# Patient Record
Sex: Female | Born: 1970 | Race: Black or African American | Hispanic: No | Marital: Single
Health system: Midwestern US, Community
[De-identification: ages and names within clinical notes are randomized; demographics above are authoritative.]

## PROBLEM LIST (undated history)

## (undated) DIAGNOSIS — I10 Essential (primary) hypertension: Secondary | ICD-10-CM

## (undated) DIAGNOSIS — M5441 Lumbago with sciatica, right side: Secondary | ICD-10-CM

## (undated) DIAGNOSIS — E041 Nontoxic single thyroid nodule: Principal | ICD-10-CM

## (undated) DIAGNOSIS — M4802 Spinal stenosis, cervical region: Principal | ICD-10-CM

## (undated) DIAGNOSIS — M545 Low back pain, unspecified: Principal | ICD-10-CM

---

## 2008-10-11 NOTE — Unmapped (Signed)
Signed by Non-EMR  Physician on 10/11/2008 at 00:00:00    Lab Report      Imported By: Pedro Earls 11/17/2008 15:04:35    _____________________________________________________________________    External Attachments:       1. Type: Image           Comment: External Document     2. Type: Image           Comment: External Document

## 2008-11-03 ENCOUNTER — Inpatient Hospital Stay

## 2008-11-03 LAB — INDIA INK: India Ink:: NORMAL
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
## 2009-01-06 ENCOUNTER — Inpatient Hospital Stay

## 2010-04-26 NOTE — Unmapped (Signed)
THE [facility]     PATIENT NAME:   Denise Robles, Denise Robles                MR #:  [id number]   DATE OF BIRTH:  [birth year]                        ACCOUNT #:  [id number]   ED PHYSICIAN:   Clementeen Hoof. Lowella Petties, M.D.          ROOM #:   PRIMARY:        No Pcp No Pcp                     NURSING UNIT:  ED   REFERRING:      Selected Referral Pt              FC:  S   DICTATED BY:    Arva Chafe, P.A.              ADMIT DATE:  04/26/2010   VISIT DATE:     04/26/2010                        DISCHARGE DATE:                               EMERGENCY DEPARTMENT NOTE     *-*-*     ***ADDENDUM - 04/26/2010 - AMW***     At this time, we are pending CT of the abdomen and pelvis with IV and oral   contrast to evaluate for her right lower quadrant pain.    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  At this time, she has been tolerating orals.  She   has had improvement in her pain.  She has had fluids and Dilaudid.  At this   time, she will be discharged home.  I did call gynecology, given the   patient's continued vaginal bleeding for the last month.  Her hemoglobin is   stable.  She has no evidence of fibroids on her ultrasound of the pelvis;   however, the patient does have a significant history of endometriosis,   extending up into the umbilicus, status post repair in 2003.  I do suspect   that her pain is likely related to maybe a recurrence of her endometriosis.   The patient also needs to be evaluated for her dysfunctional uterine   bleeding.  She is hemodynamically stable.  She will follow up on 10/25 in the   gynecology clinic at 8:00 a.m.  She will be sent home with pain medication   and nonsteroidal antiinflammatory drugs, abdominal pain discharge   instructions.  She is to return for any worsening pain, fevers, swelling,   nausea, vomiting or any worsening bleeding.     She agreed with the treatment and plan.  Dr.  Lowella Petties agreed with the   treatment and plan.     IMPRESSION:     1.  Abdominal pain.   2.  Dysfunctional vaginal uterine bleeding.     DISPOSITION:  Home in good condition.       *-*-*  _______________________________________   KP/ad                                  _____   D:  04/26/2010 15:07                  Arva Chafe, P.A.   T:  04/26/2010 16:28   Job #:  [id number]                                         _______________________________________                                          _____                                         Clementeen Hoof. Lowella Petties, M.D.                                  EMERGENCY DEPARTMENT NOTE                                                                PAGE    1 of   1                                  EMERGENCY DEPARTMENT NOTE                                                                PAGE    1 of   1

## 2010-04-26 NOTE — Unmapped (Signed)
THE [facility]     PATIENT NAME:   Denise Robles, Denise Robles                MR #:  [id number]   DATE OF BIRTH:  04/23/1971                        ACCOUNT #:  [id number]   ED PHYSICIAN:   Clementeen Hoof. Lowella Petties, M.D.          ROOM #:   PRIMARY:        No Pcp No Pcp                     NURSING UNIT:  ED   REFERRING:      Selected Referral Pt              FC:  S   DICTATED BY:    Arva Chafe, P.A.              ADMIT DATE:  04/26/2010   VISIT DATE:     04/26/2010                        DISCHARGE DATE:                               EMERGENCY DEPARTMENT NOTE     *-*-*     CHIEF COMPLAINT:  Right lower quadrant abdominal pain.     HISTORY OF PRESENT ILLNESS:  This is a 39 year old female who presents to the   emergency department with the above complaint.  She states that she has had   vaginal bleeding off and on for the last three weeks.  She states that it was   fairly mild to start and has become increasingly more heavy.  She states that   she has noticed some clots over the course of the last week.  She denies any   vaginal discharge.  She is sexually active with two people.  She states that   she has had a tubal ligation in the past.  She endorses pain which started   two days ago.  It was initially fairly mild in the right lower quadrant and   has progressed to becoming severe, colicky in nature, and radiating around to   her back.  She states that the pain comes, and she cannot do anything except   for go over into a fetal position.  She states that she has been eating and   drinking normally.  She denies any nausea or vomiting.  She states that she   has had decreased bowel movements secondary to slightly decreased oral intake   secondary to the pain.  She has taken two Tylenol which have not   significantly relieved the pain.  She is concerned for the bleeding.  She has   a history of endometriosis status post laparoscopy in 2007.  She is G3P3.   She states that she is currently not  pregnant.  She denies any pain in the   lower extremity.  She states that nothing has made her pain better   significantly. Nothing really makes it worse except for lying flat on her   back.  She states that she has not had any fevers that she knows of. She has   felt more chilled.  She has not been seen by  a primary care physician.     PAST MEDICAL HISTORY:     1.  History of endometriosis status post laparoscopy in 2007.     CURRENT MEDICATIONS:     1.  Tylenol as needed.     ALLERGIES:     1.  Penicillin which causes hives.     FAMILY HISTORY:  Noncontributory.     SOCIAL HISTORY:  The patient lives with her children.  She endorsed four   cigarettes daily, endorses alcohol socially.  Denies any drug use.     REVIEW OF SYSTEMS:  As above. Otherwise, negative per the patient.     PHYSICAL EXAMINATION:     VITAL SIGNS:  Blood pressure 176/114, pulse 92, respirations 22, temperature   98.1, sats 99% on room air.   GENERAL:  Well-developed, well-nourished 39 year old female in no acute   distress, but she appears uncomfortable.  She is alert. She is oriented x 4.   She ambulates in the emergency department.   HEENT:  Normocephalic without sign of trauma.  Pupils are round and reactive   to light and accommodation.  Extraocular movements intact.  Oropharynx clear   without lesions.  Mucous membranes moist.   NECK:  Supple without masses or lymphadenopathy.   HEART:  Regular rate and rhythm.  I do not appreciate a murmur.   LUNGS:  Clear to auscultation bilaterally. No wheezes, rhonchi or rales.   Equal breath sounds bilaterally.   ABDOMEN:  Soft.  The patient has tenderness focally in the right lower   quadrant extending now deeper into the pelvic region.  She has no true   guarding. The abdomen is soft.  She has no other tenderness to palpation to   the remainder of the abdomen.  Bowel sounds are present and active in all   four quadrants.  She reports mild suprapubic tenderness.  There is no   distention to the  abdomen.   BACK:  No midline tenderness.  There is no CVA tenderness.   EXTREMITIES:  The patient moves all extremities spontaneously.  Palpable   radial pulses 2+, palpable dorsalis pedis pulse 2+.   NEUROLOGIC:  Cranial nerves II through XII grossly intact.  The patient is   alert and oriented.  She ambulates in the emergency department.   GENITOURINARY:  The patient has normal external female anatomy.  There is   noted blood in the vaginal vault, mild to moderate amount.  The cervix was   visualized.  The os is closed.  There is no obvious purulent discharge noted.   There is no cervical motion tenderness.  There is tenderness to palpation   over the right adnexal area.  No masses palpated.  There is mild tenderness   noted to palpation to the left adnexal area.  Again, no masses palpated.     EMERGENCY DEPARTMENT COURSE:  The patient was seen and evaluated by myself,   as well as my attending.  She underwent history and physical.  She was given   1 mg of Dilaudid, a liter of fluids.  She was given a second mg of Dilaudid.   She underwent laboratory evaluation. She had a pelvic ultrasound done here in   main ultrasound, and a urinalysis was obtained.  At this time, she is pending   a CT of the abdomen and pelvis with IV and oral contrast for evaluation for   right lower quadrant pain with concern for possible appendicitis or other  intra-abdominal abnormality.  She is hemodynamically stable here.     Laboratory evaluation reveals a white count of 7.6, hemoglobin 11.3,   hematocrit 36.1, platelets 298.  There is no left shift.  Sodium 140,   potassium 4.1, chloride 109, CO2 27, BUN 6, creatinine 0.6.  AST 25, ALT 19,   bilirubin 0.3, protein 8, albumin 4, calcium 9.5, lipase 74, alkaline   phosphatase 78.  Coags are unremarkable.  Urinalysis reveals negative   protein, ketones, sugar, rare bacteria, trace leukocyte esterase, negative   nitrite, 1 white blood cell, 2 red cells, bilirubin is negative. Trichomonas    is negative.  DNAP is pending.     Please see addendum dictation for results of CT Scan.     Imaging of the pelvis via ultrasound reveals normal uterus in contour and   echogenicity. The endometrial stripe measures 6.3 mm in thickness, mildly   heterogenous likely due to ongoing menses, a trace amount of pelvic free   fluid.  Ovaries are normal in appearance bilaterally.  Impression: Normal   pelvic ultrasound.     Please see addendum dictation for ultimate disposition.       *-*-*                                             _______________________________________   KP/jli                                 _____   D:  04/26/2010 12:09                  Arva Chafe, P.A.   T:  04/26/2010 13:04   Job #:  [id number]                                         _______________________________________                                          _____                                         Clementeen Hoof. Lowella Petties, M.D.                                  EMERGENCY DEPARTMENT NOTE                                                                PAGE    1 of   1                                          _____  Clementeen Hoof. Lowella Petties, M.D.                                  EMERGENCY DEPARTMENT NOTE                                                                PAGE    1 of   1

## 2010-05-01 ENCOUNTER — Inpatient Hospital Stay

## 2010-05-01 LAB — POC URINALYSIS
Bilirubin Urine: NEGATIVE
Glucose, UA: NEGATIVE g/dL
Ketones, UA: NEGATIVE
Leukocytes, UA: NEGATIVE
Nitrite, UA: NEGATIVE
Protein, UA: NEGATIVE
Specific Gravity, UA: <=1.005 (ref 1.005–1.035)
Urobilinogen, UA: 0.2 E units/dL (ref 0.2–1.0)
pH, UA: 6 (ref 5.0–8.0)

## 2010-05-01 NOTE — Unmapped (Signed)
Signed by Berdie Ogren MD on 05/01/2010 at 10:46:31  Patient: Denise Robles  Note: All result statuses are Final unless otherwise noted.    Tests: (1) POC HCG QUALITATIVE, URINE (POCPREG)  ! hCG Qualitative           Negative                    Negative    Note: An exclamation mark (!) indicates a result that was not dispersed into   the flowsheet.  Document Creation Date: 05/01/2010 10:35 AM  _______________________________________________________________________    (1) Order result status: Final  Collection or observation date-time: 05/01/2010 10:23  Requested date-time: 05/01/2010 10:23  Receipt date-time: 05/01/2010 09:56  Reported date-time: 05/01/2010 10:34  Referring Physician: Cindi Carbon NONSTAFF  Ordering Physician: REFERRING(UOP) NONSTAFF [facility])  Specimen Source: U&URINE     UACUP&URINALYSIS CONTAINER  Source: Faith Rogue Order Number: [id number]  Lab site: [facility]      [address]      [place] [address]  [phone]

## 2010-05-01 NOTE — Unmapped (Signed)
Signed by Berdie Ogren MD on 05/01/2010 at 10:46:31  Patient: Denise Robles  Note: All result statuses are Final unless otherwise noted.    Tests: (1) POC URINALYSIS (POCUA)    Glucose, Urine            Negative mg/dL              Negative    Bilirubin, Urine          Negative                    Negative    Ketone                    Negative mg/dL              Negative    Specific Gravity          <=1.005                     1.005-1.035    Blood                [A]  Trace-intact                Negative    pH                        6.0                         5.0-8.0    Protein, urine            Negative mg/dL              Negative    Urobilinogen              0.2 mg/dL                   7.2-5.3    Nitrite                   Negative                    Negative    Leukocyte Esterase        Negative                    Negative    Note: An exclamation mark (!) indicates a result that was not dispersed into   the flowsheet.  Document Creation Date: 05/01/2010 9:42 AM  _______________________________________________________________________    (1) Order result status: Final  Collection or observation date-time: 05/01/2010 09:35  Requested date-time: 05/01/2010 09:35  Receipt date-time: 05/01/2010 09:03  Reported date-time: 05/01/2010 09:41  Referring Physician: Cindi Carbon NONSTAFF  Ordering Physician: REFERRING(UOP) NONSTAFF [facility])  Specimen Source: U&URINE     UACUP&URINALYSIS CONTAINER  Source: Faith Rogue Order Number: [id number]  Lab site: [facility]      [address]      [place] [address]  [phone]      -----------------    The following lab values were dispersed to the flowsheet  with no units conversion:      Urobilinogen, 0.2 MG/DL, (F)  expected units: E units/dL    -----------------    The following non-numeric lab results were dispersed to  the flowsheet even though numeric results were expected:      Glucose, Urine, Negative    Specific Gravity, <=1.005

## 2010-05-01 NOTE — Unmapped (Signed)
Signed by Lowella Petties MD on 05/01/2010 at 13:17:14        Reason for Visit   Chief Complaint: here for bleeding along wth severe pain  History from: patient    Allergies  ! PENICILLIN    Medications  FLEXERIL  TABS (CYCLOBENZAPRINE HCL TABS)   IBUPROFEN  CAPS (IBUPROFEN CAPS) prescription stregth as needed          Vital Signs  Height: 65 in.   Weight: 195.3 lbs.     BMI (in-lb): 32.62   BSA (m2): 1.96  Pulse rate: 100   Temperature: 98.2 degrees  F   Blood Pressure   BP #1: 160 / Hg  Cuff Size: Std     Pain:     Have you had pain other than everyday aches and pains (e.g., mild headache, back ache, strains) in the past week?   Yes  Pain Scale: 10 out of 10    Intake recorded by: Collier Salina MA on May 01, 2010 9:35 AM               History of Present Illness       39 yo G3P3 with a history fo endometriosis here for follow up from ED 04/26/10 for pelvic/abdominal pain and abnormal bleeding.  Pt has had VB for approximately 3 weeks, which stopped two days ago.  Pt states that her periods have been regular until the past few weeks.   Pain sharp, started suprapubically and now is in the RLQ/lower back region.  Sharp, aching, not taking vicodin because it makes her feel sick.  Pain better with standing, worse with sitting.  Has gotten worse since seen in the ED last week.  Starting to have nausea but no vomiting. Last BM yesterday.    TVUS showed 9.2x4.3x6.7cm uterus with endometrial stripe 6.73mm, normal ovaries.  CT scan negative. GC/CT negative, Wet prep negative 10/20.      PAST HISTORY  Past Medical History:  No significant past medical history.  Surgical History:  Cesarean Section: *, Laparoscopy: for endometriosis 2007, endometerosis removal, No problems with anesthesia, No problems with healing, No problems with blood clots after surgery    Family History: no breast/ovarian/colon cancer    Mother - stroke, HTN  Father - DM  Uncle - CA  Social History: Alcohol Use: occasionally  Beedies (indonesian  cigarette, not tobacco based)  Drug Use: none  Tobacco Usage:smoker  Cigarettes-Packs per Day- 0.2,           Physical Examination  Constitutional: Crying..    Abdomen: nondistended, no masses, no organomegaly, no abdominal hernias, no suprapubic tenderness;  Tender to palpation RLQ, ? rebound, no guarding.  No back tenderness..    External Genitalia:  no lesions, normal estrogen effect, normal hair pattern, no clitorimegaly, no erythema, normal perineal body;    Urethra:  normal appearance, no erythema, no tenderness, normal urethral meatus;    Vagina:  no discharge, no lesions, no masses, no cystocele, no rectocele, normal support, normal rugations;    Cervix:  present, no lesions, no discharge, no cervical motion tenderness;    Uterus:  uterus midline, normal size, non-tender, regular contour, mobile;    Adnexa:  no masses;  mildly tender to palpation in right adnexa, no masses or fullness.  Pubic symphysis tender to palpation. Tight muscular band felt at 10:00..    Back: no deformities, normal spine, no cva tenderness;  right SI joint pain.Marland Kitchen    Extremities: no edema,  no lesions;                      Procedure Performed: Trigger Point Injections     Procedure in Detail:   The patient's chart was reviewed.  The area was prepped using antiseptic solution.      The following trigger points were identified:      Lumbar/Sacral:   Right trigger point @ 10:00 injected in vagina.    Then, a 18 gauge  needle was inserted into each point.  A total mixture of 20 ml of 0.5% Marcaine 9cc with 1cc kenalog 4mg /ml., 5 ml solution was injected into each point.      The patient tolerated the procedure well.      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  1.  Trigger point injection given - pt feeling much better afterwards.  2.  Amitriptylene 25mg  at bedtime and ibuprofen 800mg  three times a  day.  3.  Pt instructed to have modified bedrest, take hot baths, and to massage area.  4.  RTC 1 week for follow up. - Berdie Ogren MD - Signed    Medications   New Prescriptions/Refills:  AMITRIPTYLINE HCL 25 MG  TABS (AMITRIPTYLINE HCL) one tablet by mouth at bedtime.  #30 x 1, 05/01/2010, Sz Fredrich Romans MD  IBUPROFEN 800 MG TABS (IBUPROFEN) one tablet by mouth every 8 hours hours.  #30 x 1, 05/01/2010, Berdie Ogren MD      Disposition:     *Patient Care Summary printed and given to patient.    Primary OB/GYN Provider: Imogene Burn  Return to clinic for Provider Visit in 1 week(s)   Appointment Reason: okay to book at 7:45am.      Preceptor Acknowledgements    I saw and examined the patient.  I discussed with the resident or fellow and agree with resident's/fellow's findings and plan as documented in the note.  A&P exam consistent with levator ani muscle spasm and SI joint pain as well as suprapubic symphysis pain.  trigger point injection today helped resolve pain.  follow-up one week for repeat trigger point injection.    Approximately 80 minutes was spent with this patient, over 45 minutes in face-to-face consultation.     Preceptor Acknowledgements Procedures:     I was present for the:   entire procedure    Preceptor: Lowella Petties MD on May 01, 2010 1:10 PM    Prescriptions:  AMITRIPTYLINE HCL 25 MG  TABS (AMITRIPTYLINE HCL) one tablet by mouth at bedtime.  #30 x 1   Entered and Authorized by: Berdie Ogren MD   Signed by: Berdie Ogren MD on 05/01/2010   Method used: Handwritten   RxID: [id number]  IBUPROFEN 800 MG TABS (IBUPROFEN) one tablet by mouth every 8 hours hours.  #30 x 1   Entered and Authorized by: Berdie Ogren MD   Signed by: Berdie Ogren MD on 05/01/2010   Method used: Handwritten   RxID: [id number]

## 2010-05-15 NOTE — Unmapped (Signed)
THE [facility]     PATIENT NAME:   Denise Robles, Denise Robles                MR #:  [id number]   DATE OF BIRTH:  1971/01/27                        ACCOUNT #:  [id number]   ED PHYSICIAN:   Earlean Shawl. Mora Bellman, M.D.           ROOM #:   PRIMARY:        No Pcp No Pcp                     NURSING UNIT:  ED   REFERRING:      Selected Referral Pt              FC:  S   DICTATED BY:    Arthuro Canelo L. Urbano Heir, M.D.            ADMIT DATE:  05/14/2010   VISIT DATE:     05/14/2010                        DISCHARGE DATE:                               EMERGENCY DEPARTMENT NOTE     *-*-*     CHIEF COMPLAINT:  Chest pain.     HISTORY OF PRESENT ILLNESS:  This is a 39 year old female with no significant   past medical history who comes in with the chief complaint of chest pain.   The patient reports that this pain started last night in her chest and she   says that it hurts when she swallows.  She reports that she feels like she is   having difficulty swallowing because of this, but she has been able to get   liquids and solids down.  Despite this pain, the patient is not having any   regurgitation of food.  The patient also reports that she has felt slightly   short of breath when she lays down but when she sits back up, she feels   normal.  She says that the chest pain gets slightly worse with deep breath.   Overall, she says that the pain is immediately within the center of her chest   and is a hot, tight sensation and it nothing makes it better or worse.  The   patient says that she has been taking a lot of ibuprofen because of this, but   she denies any fever, positional quality to the pain, arm swelling, previous   symptoms like this, palpitations, abdominal pain, nausea, vomiting, diarrhea   or fever.     REVIEW OF SYSTEMS:  Please see HPI.  All other review of systems reviewed   with the patient and are negative.     PAST MEDICAL HISTORY:     1.  Endometriosis.     MEDICATIONS:     1.  Ibuprofen.   2.   Elavil.     ALLERGIES:     1.  Penicillin.     FAMILY HISTORY:  The patient is not aware of any diseases that run in her   family.     SOCIAL HISTORY:  This patient smokes one pack per day, occasional alcohol,   occasional marijuana.  PHYSICAL EXAMINATION:   VITAL SIGNS:  Blood pressure 185/107, pulse 99, respiratory rate 20,   temperature 100.1, saturations 100% on room air.   GENERAL:  The patient is in mild acute distress, appears stated age, good   hygiene, normal development.   HEENT:  Normocephalic, atraumatic.  Pupils equally round and reactive to   light.  Extraocular movements intact.  No conjunctival injection, icterus or   discharge.  Oropharynx is clear without lesions, exudate or erythema.  Mucous   membranes are moist.   NECK:  No lymphadenopathy, no thyromegaly.  Trachea midline.  No JVD, no   nuchal rigidity.   CARDIOVASCULAR:  Regular rate and rhythm.  Clear S1, S2.  No murmurs.   LUNGS:  Clear to auscultation bilaterally.  Good breath sounds.  No rales,   rhonchi, wheezing, or stridor.   CHEST:  No pain on palpation of her chest.   ABDOMEN:  Soft, nontender, nondistended, no organomegaly.  She has good bowel   sounds.   GENITOURINARY:  No suprapubic pain.  No CVA tenderness.   MUSCULOSKELETAL:  Normal muscle bulk, full range of motion.  No joint   swelling, erythema or pain.   NEUROLOGIC:  The patient is alert and oriented x3.  No gross neurological   defects.   SKIN:  No rash, dry.   EXTREMITIES:  No cyanosis, good capillary refill, good pulses, no edema.   PSYCHIATRIC:  Pleasant, conversant and answers appropriately.     LABORATORY DATA AND IMAGING:  CBC significant only for a white blood cell   count of 15.1, otherwise within normal limits.  LFTs and lipase and   electrolyte panel within normal limits.     Chest x-ray showed no acute cardiopulmonary process.     TSH and T4 were within normal limits.  Aspirin level was within normal   limits.  Troponin sent via nursing protocol was within  normal limits.     EKG done via nursing protocol.  Normal sinus rhythm, normal axis, normal PR,   QRS and QT intervals, no ST or T-wave changes.  Left ventricular hypertrophy   present.  Impression:  Normal sinus rhythm with left ventricular hypertrophy.     EMERGENCY DEPARTMENT COURSE:  The patient was admitted to the emergency   department and evaluated by myself and the attending physician.  The   assessment and plan was discussed and agreed upon.  Diagnostic tests were   obtained as discussed above.  Nursing notes and old charts were reviewed.   The patient was given a GI cocktail to drink here in the emergency department   and this significantly helped her symptoms.     MEDICAL DECISION MAKING:  At this time, I think likely her symptoms of chest   pain are due to some form of esophagitis.  It is not clear to me exactly what   is the cause of her esophagitis as it could be due to reflux, it could be due   to a Mallory-Weiss tear, it could be due to an infection such as candidiasis   or CMV, but there are no obvious signs of any of these on my exam here today.   So, ultimately, I think the patient will need an upper endoscopy to help   determine what the cause of her symptoms are.  Given the fact that she is   having difficulty swallowing and she has pain in her chest with swallowing, I   think this likely points to  the esophagus, especially with her good response   to a GI cocktail.  I think this also indicates likely that this is due to her   esophagus.  The patient has had a stress test back in March of 2010, which   was normal, but given how atypical this is I do not believe that she needs a   further workup for ACS nor does she need a further workup for pulmonary   embolism since she is otherwise saturating normally and she is not tachypneic   and her shortness of breath was only when she laid down flat.  At this point,   I will be starting her on omeprazole in case she is having gastritis which is   causing  reflux causing esophagitis and I will help get her follow-up to get   an upper endoscopy as she does not currently have a doctor.     IMPRESSION:     1.  Esophagitis.     DISPOSITION:  Home in good condition.     PLAN:     1.  The patient instructed to take Tylenol as needed for pain.   2.  Omeprazole 20 mg by mouth every day.   3.  Crissie Figures was contacted to help get the patient a semi urgent   followup with a PCP.   4.  The patient was told her to take Pepto-Bismol to help with esophageal   pain.   5.  The patient was told to return to the emergency department for any   worsening of her symptoms or for any new symptoms that concern her.     This entire plan was discussed with the patient and she expressed   understanding and was in agreement.       *-*-*                                             _______________________________________   JLB/md                                 _____   D:  05/15/2010 00:16                  Torryn Hudspeth L. Urbano Heir, M.D.   T:  05/15/2010 08:37   Job #:  [id number]                                         _______________________________________                                          _____                                         Earlean Shawl. Mora Bellman, M.D.                                  EMERGENCY DEPARTMENT NOTE  PAGE    1 of   1                                         Donald A. Mora Bellman, M.D.                                  EMERGENCY DEPARTMENT NOTE                                                                PAGE    1 of   1

## 2013-04-27 MED ORDER — AMLODIPINE BESYLATE 5 MG PO TABS
5 MG | ORAL_TABLET | Freq: Every day | ORAL | Status: DC
Start: 2013-04-27 — End: 2013-05-11

## 2013-04-27 NOTE — Progress Notes (Signed)
Subjective:      Patient ID: Betty Cox is a 42 y.o. female.    HPI  Here to become established  She had been to [facility] ER after a fall   There her blood pressure was elevated and was tod to follow up with PCP   She has had some previous elevations of her blood pressure but has not been on any medications  Sometimes they will run 156-165/90-100  She denies any headache, chest pain or shortness of breath with this  She is attempting to quit smoking and has been enrolled in [employer]'s smoking cessation  She wants to start working out but needed medical clearance     Review of Systems   Eyes: Negative for visual disturbance.   Respiratory: Negative for cough, chest tightness, shortness of breath and wheezing.    Cardiovascular: Negative for chest pain, palpitations and leg swelling.   Neurological: Negative for headaches.       Objective:   Physical Exam   Constitutional: She is oriented to person, place, and time. She appears well-developed and well-nourished.   HENT:   Right Ear: External ear normal.   Left Ear: External ear normal.   Eyes: Conjunctivae and EOM are normal. Pupils are equal, round, and reactive to light.   Neck: No thyromegaly present.   Cardiovascular: Normal rate, regular rhythm, normal heart sounds and intact distal pulses.  Exam reveals no gallop and no friction rub.    No murmur heard.  Pulmonary/Chest: Effort normal and breath sounds normal. No respiratory distress. She has no wheezes. She has no rales.   Abdominal: Soft. Bowel sounds are normal. She exhibits no distension. There is no tenderness. There is no rebound.   No abdominal bruit     Musculoskeletal: She exhibits no edema.   Lymphadenopathy:     She has no cervical adenopathy.   Neurological: She is alert and oriented to person, place, and time.   Skin: Skin is warm and dry.   Psychiatric: She has a normal mood and affect.       Assessment:         1. HTN (hypertension)  Lipid Panel    Comprehensive Metabolic Panel    CBC Auto  Differential    TSH without Reflex   2. Screening for malignant neoplasm of breast  MAM Digital Screen Bilateral            Plan:       start norvasc 5 mg and follow up 2 weeks  Plan to titrate to 10 mg and if not controlled by this add on lotensin ( lotrel)  She does not want flu shot  Mammogram scheduled

## 2013-04-28 LAB — LIPID PANEL
Cholesterol, Total: 140 mg/dL (ref 0–199)
HDL: 64 mg/dL — ABNORMAL HIGH (ref 40–60)
LDL Calculated: 63 mg/dL (ref <100)
Triglycerides: 64 mg/dL (ref 0–150)
VLDL Cholesterol Calculated: 13 mg/dL

## 2013-04-28 LAB — COMPREHENSIVE METABOLIC PANEL
ALT: 12 U/L (ref 10–40)
AST: 15 U/L (ref 15–37)
Albumin/Globulin Ratio: 1.3 (ref 1.1–2.2)
Albumin: 4.1 g/dL (ref 3.4–5.0)
Alkaline Phosphatase: 79 U/L (ref 40–129)
BUN: 6 mg/dL — ABNORMAL LOW (ref 7–20)
CO2: 22 mmol/L (ref 21–32)
Calcium: 9 mg/dL (ref 8.3–10.6)
Chloride: 103 mmol/L (ref 99–110)
Creatinine: 0.6 mg/dL (ref 0.6–1.1)
GFR African American: >60 (ref >60)
GFR Non-African American: >60 (ref >60)
Globulin: 3.2 g/dL
Glucose: 85 mg/dL (ref 70–99)
Potassium: 4.1 mmol/L (ref 3.5–5.1)
Sodium: 141 mmol/L (ref 136–145)
Total Bilirubin: <0.2 mg/dL (ref 0.0–1.0)
Total Protein: 7.3 g/dL (ref 6.4–8.2)

## 2013-04-28 LAB — CBC WITH AUTO DIFFERENTIAL
Basophils %: 0.4 %
Basophils Absolute: 0 10*3/uL (ref 0.0–0.2)
Eosinophils %: 0.9 %
Eosinophils Absolute: 0.1 10*3/uL (ref 0.0–0.6)
Hematocrit: 42.3 % (ref 36.0–48.0)
Hemoglobin: 13.3 g/dL (ref 12.0–16.0)
Lymphocytes %: 27.2 %
Lymphocytes Absolute: 2.3 10*3/uL (ref 1.0–5.1)
MCH: 27.1 pg (ref 26.0–34.0)
MCHC: 31.4 g/dL (ref 31.0–36.0)
MCV: 86.6 fL (ref 80.0–100.0)
MPV: 9.3 fL (ref 5.0–10.5)
Monocytes %: 7.7 %
Monocytes Absolute: 0.6 10*3/uL (ref 0.0–1.3)
Neutrophils %: 63.8 %
Neutrophils Absolute: 5.4 10*3/uL (ref 1.7–7.7)
Platelets: 289 10*3/uL (ref 135–450)
RBC: 4.89 M/uL (ref 4.00–5.20)
RDW: 15.6 % — ABNORMAL HIGH (ref 12.4–15.4)
WBC: 8.5 10*3/uL (ref 4.0–11.0)

## 2013-04-28 LAB — TSH: TSH: 0.85 u[IU]/mL (ref 0.27–4.20)

## 2013-05-11 MED ORDER — AMLODIPINE BESYLATE 10 MG PO TABS
10 MG | ORAL_TABLET | Freq: Every day | ORAL | Status: DC
Start: 2013-05-11 — End: 2013-09-21

## 2013-05-11 NOTE — Progress Notes (Signed)
Subjective:      Patient ID: Betty Cox is a 42 y.o. female.    HPI  Here for blood pressure follow up  She has been taking the norvasc 5 mg a day   She is tolerating this well   She states that her wrist cuff monitor at home has been giving her readings of 140-150/ 90's at home   She has been having some dizziness     Review of Systems   Respiratory: Negative for cough, shortness of breath and wheezing.    Cardiovascular: Negative for chest pain, palpitations and leg swelling.   Neurological: Positive for dizziness.       Objective:   Physical Exam   Constitutional: She is oriented to person, place, and time.   Cardiovascular: Normal rate, regular rhythm, normal heart sounds and intact distal pulses.  Exam reveals no gallop and no friction rub.    No murmur heard.  Pulmonary/Chest: Effort normal and breath sounds normal. No respiratory distress. She has no wheezes. She has no rales.   Musculoskeletal: She exhibits no edema.   Neurological: She is alert and oriented to person, place, and time.   Skin: Skin is warm and dry.   Psychiatric: She has a normal mood and affect.       Assessment:      1. HTN (hypertension)             Plan:       increase the Norvasc to 10 mg a day  Good rx coupon and website given  She will bring her monitor next visit  I believe there could be an element of "white coat" syndrome but we need to prove that with normal or good pressures at home ( with a monitor that is reliable) and elevated readings here   She will return in 2-3 weeks for recheck and monitor check

## 2013-05-11 NOTE — Patient Instructions (Signed)
Bring blood pressure monitor next visit so we can check  Try the web site good rx

## 2013-06-14 MED ORDER — HYDROCHLOROTHIAZIDE 12.5 MG PO CAPS
12.5 MG | ORAL_CAPSULE | Freq: Every day | ORAL | Status: DC
Start: 2013-06-14 — End: 2013-07-29

## 2013-06-14 NOTE — Progress Notes (Signed)
Subjective:      Patient ID: Betty Cox is a 42 y.o. female.    HPI  Here for follow up  She is feeling better with her blood pressure  She is not having any side effects from the medication    Review of Systems   Respiratory: Negative for cough, chest tightness and wheezing.    Cardiovascular: Negative for chest pain, palpitations and leg swelling.       Objective:   Physical Exam   Constitutional: She is oriented to person, place, and time.   Cardiovascular: Normal rate, regular rhythm, normal heart sounds and intact distal pulses.  Exam reveals no gallop and no friction rub.    No murmur heard.  Pulmonary/Chest: Effort normal and breath sounds normal. No respiratory distress. She has no wheezes. She has no rales.   Musculoskeletal: She exhibits no edema.   Neurological: She is alert and oriented to person, place, and time.   Skin: Skin is warm and dry.   Psychiatric: She has a normal mood and affect.       Assessment:        HTN       Plan:       my recheck is improved  I would add a small dose of microzide   Recheck in 6 weeks  She has a home monitor  She will recheck it at home and bring at next visit

## 2013-07-29 MED ORDER — HYDROCHLOROTHIAZIDE 25 MG PO TABS
25 MG | ORAL_TABLET | Freq: Every day | ORAL | Status: DC
Start: 2013-07-29 — End: 2014-06-07

## 2013-07-29 NOTE — Progress Notes (Signed)
Subjective:      Patient ID: Betty Cox is a 43 y.o. female.    HPI  Blood pressures running lower at home 130-140 and in the 80's diastolic  Battery is low and is not been checking it at home recently     Review of Systems   Respiratory: Negative for cough, chest tightness and wheezing.    Cardiovascular: Negative for chest pain, palpitations and leg swelling.       Objective:   Physical Exam   Constitutional: She is oriented to person, place, and time.   Cardiovascular: Normal rate, regular rhythm, normal heart sounds and intact distal pulses.  Exam reveals no gallop and no friction rub.    No murmur heard.  Pulmonary/Chest: Effort normal and breath sounds normal. No respiratory distress. She has no wheezes. She has no rales.   Musculoskeletal: She exhibits no edema.   Neurological: She is alert and oriented to person, place, and time.   Skin: Skin is warm and dry.   Psychiatric: She has a normal mood and affect.       Assessment:         1. HTN (hypertension)             Plan:       continue norvasc  I increased the HCTZ to 25 mg  Follow up 3 months annual  Ok to go to gym

## 2013-09-21 MED ORDER — AMLODIPINE BESYLATE 10 MG PO TABS
10 MG | ORAL_TABLET | Freq: Every day | ORAL | Status: DC
Start: 2013-09-21 — End: 2014-01-25

## 2013-09-21 NOTE — Telephone Encounter (Signed)
PT NEEDS A REFILL OF HER NORVASC * LAST OV ON 1.22.15. PT IS OUT OF HER MEDS.   PHARM# [phone]

## 2013-10-18 MED ORDER — HYDROCHLOROTHIAZIDE 12.5 MG PO CAPS
12.5 MG | ORAL_CAPSULE | ORAL | Status: DC
Start: 2013-10-18 — End: 2014-01-24

## 2014-01-04 NOTE — Progress Notes (Signed)
MAM Digital Screen Bilateral (Order [id number]) order not completed and expired

## 2014-01-24 MED ORDER — HYDROCHLOROTHIAZIDE 12.5 MG PO CAPS
12.5 MG | ORAL_CAPSULE | ORAL | Status: DC
Start: 2014-01-24 — End: 2014-01-25

## 2014-01-24 NOTE — Telephone Encounter (Signed)
PT CALLING FOR REFILLS ON HCTZ 12.5MG  AND AMLODIPINE 10MG  TO BE CALLED IN TO [facility] [phone]

## 2014-01-24 NOTE — Telephone Encounter (Signed)
Last ov 07/29/13 last refill 10/18/13

## 2014-01-25 MED ORDER — AMLODIPINE BESYLATE 10 MG PO TABS
10 MG | ORAL_TABLET | Freq: Every day | ORAL | Status: DC
Start: 2014-01-25 — End: 2014-06-06

## 2014-01-25 MED ORDER — HYDROCHLOROTHIAZIDE 12.5 MG PO CAPS
12.5 MG | ORAL_CAPSULE | ORAL | Status: DC
Start: 2014-01-25 — End: 2014-08-15

## 2014-06-06 MED ORDER — AMLODIPINE BESYLATE 10 MG PO TABS
10 MG | ORAL_TABLET | Freq: Every day | ORAL | Status: DC
Start: 2014-06-06 — End: 2014-07-15

## 2014-06-06 NOTE — Telephone Encounter (Signed)
LAST SEEN       NEXT APPOINTMENT       LAST FILL  1.22.15  None   Amlodipine- 7.21.15

## 2014-06-06 NOTE — Telephone Encounter (Signed)
PT CALLING FOR REFILL ON AMLODIPINE AND HCTZ TO BE CALLED IN TO [facility] [phone]

## 2014-06-07 MED ORDER — HYDROCHLOROTHIAZIDE 25 MG PO TABS
25 MG | ORAL_TABLET | Freq: Every day | ORAL | Status: DC
Start: 2014-06-07 — End: 2015-09-05

## 2014-06-08 NOTE — Telephone Encounter (Signed)
Pt calling in stating that she went to pick up script for hydrochlorothiazide (MICROZIDE) 12.5 MG capsule pt says that she normally takes this script and dose but somehow the script that was filled for the 25 mg dose patient says that she would like to stay on the 12.5 mg tab until speaking with Dr Doristine CounterGiulitto about the change. Pt says that she did not pick up the 25 mg script she left it at the pharmacy. [facility] [address] - [place], [place] - [address] - P [phone] F [phone]

## 2014-06-08 NOTE — Telephone Encounter (Signed)
Please Advise,  Thank you

## 2014-06-08 NOTE — Telephone Encounter (Signed)
Looking at the last office note with Dr. Doristine CounterGiulitto, it states she was increasing the HCTZ to 25 mg. This was from 07/29/2013. Somehow there were still refills of 12.5 being called in. She probably should also be making an appt to come in to be [facility], as her last office visit was almost 1 year ago on  07/29/2013

## 2014-06-10 NOTE — Telephone Encounter (Signed)
noted 

## 2014-07-15 MED ORDER — AMLODIPINE BESYLATE 10 MG PO TABS
10 MG | ORAL_TABLET | ORAL | Status: DC
Start: 2014-07-15 — End: 2014-08-15

## 2014-07-15 NOTE — Telephone Encounter (Signed)
PT CALLING TO LET DR KNOW SHE WILL NOT BE ABLE TO COME IN FOR APPT UNTIL FEB.   SHE HAS MADE APPT FOR 08-23-14.   SHE SAYS SHE IS NOT ALLOWED A DAY OFF IN JAN BECAUSE IT IS THEIR BUSIEST MONTH.   SHE APPRECIATES THE REFILL ON AMLODIPINE BUT WANTS DR TO KNOW SHE MAY NEED ANOTHER REFILL ON IT AND HCTZ UNTIL SHE IS IN FOR HER APPT    FYI

## 2014-07-15 NOTE — Telephone Encounter (Signed)
1 refill  Patient needs to schedule appt

## 2014-07-15 NOTE — Telephone Encounter (Signed)
LAST SEEN       NEXT APPOINTMENT       LAST FILL    1.22.15  None    11.30.15

## 2014-07-18 NOTE — Telephone Encounter (Signed)
That is fine

## 2014-08-15 MED ORDER — AMLODIPINE BESYLATE 10 MG PO TABS
10 MG | ORAL_TABLET | ORAL | Status: DC
Start: 2014-08-15 — End: 2014-08-23

## 2014-08-15 MED ORDER — HYDROCHLOROTHIAZIDE 12.5 MG PO CAPS
12.5 MG | ORAL_CAPSULE | ORAL | Status: DC
Start: 2014-08-15 — End: 2014-08-23

## 2014-08-15 NOTE — Telephone Encounter (Signed)
PT CALLING SAYING SHE IS OUT OF AMLODIPINE AND MICROZIDE 12.5MG  TO BE CALLED IN TO [facility] [phone].   SHE SAYS SHE IS OUT AND HER APPT IS NEXT WEEK, 2-16 AT 9AM.   SHE WOULD APPRECIATE ENOUGH TO GET HER TO THAT APPT

## 2014-08-23 ENCOUNTER — Ambulatory Visit: Admit: 2014-08-23 | Discharge: 2014-08-23 | Payer: PRIVATE HEALTH INSURANCE | Attending: Internal Medicine

## 2014-08-23 DIAGNOSIS — Z0000 Encounter for general adult medical examination without abnormal findings: Secondary | ICD-10-CM

## 2014-08-23 MED ORDER — AMLODIPINE BESYLATE 10 MG PO TABS
10 MG | ORAL_TABLET | ORAL | Status: DC
Start: 2014-08-23 — End: 2015-09-05

## 2014-08-23 MED ORDER — HYDROCHLOROTHIAZIDE 12.5 MG PO CAPS
12.5 MG | ORAL_CAPSULE | ORAL | Status: AC
Start: 2014-08-23 — End: ?

## 2014-08-23 NOTE — Progress Notes (Signed)
Subjective:      Patient ID: Betty Cox is a 44 y.o. female.    HPI  Here for annual follow up  She is doing well  She has been watching her diet and exercising  She is also going to be doing Tai Chi in attempt to quit smoking  She has a GYN and plans on following up there for her GYN care    Review of Systems   Respiratory: Negative for cough, chest tightness and wheezing.    Cardiovascular: Negative for chest pain, palpitations and leg swelling.   Gastrointestinal: Negative for abdominal distention.   Genitourinary: Positive for menstrual problem (she is having lighter cycles).       Objective:   Physical Exam   Constitutional: She is oriented to person, place, and time. She appears well-developed and well-nourished.   Eyes: Pupils are equal, round, and reactive to light.   Neck: No thyromegaly present.   Cardiovascular: Normal rate, regular rhythm, normal heart sounds and intact distal pulses.  Exam reveals no gallop and no friction rub.    No murmur heard.  Pulmonary/Chest: Effort normal and breath sounds normal. No respiratory distress. She has no wheezes. She has no rales.   Musculoskeletal: She exhibits no edema.   Lymphadenopathy:     She has no cervical adenopathy.   Neurological: She is alert and oriented to person, place, and time.   Skin: Skin is warm and dry.   Psychiatric: She has a normal mood and affect.       Assessment:         1. Annual physical exam     2. Essential hypertension  Comprehensive Metabolic Panel    Lipid Panel   3. Screening for malignant neoplasm of breast  MAM Digital Screen Bilateral    Comprehensive Metabolic Panel    Lipid Panel            Plan:       she will get mammogram  She declines immunizations  Check lab  Follow up 1 yr or as needed

## 2014-08-23 NOTE — Patient Instructions (Signed)
[place]: [phone]  Smoking cessation programs in [place]    [employer]: [phone]  "Freshstart" a 4-session program for smoking cessation - group sessions    [place] [facility]: 1800-QUIT-NOW     [facility]: [phone]  "Freshstart' - 4-session program for smoking cessation - group sessions    [facility]: [phone]  Personal Smoking cessation consultations - teens and adults  [place] By appointment $225    [facility]: [phone]  Offers individual hypnosis, behavior modifications, and counseling in this program. Three sessions over 2-week period  $155 (total cost)    [employer]: [phone]  Open group cessation - everyone welcome     American [employer]: [phone]    American [employer] Association: 1-800-LUNG-[place]    On-line help:  www.cancer.org  www.quitnet.org  www.whyquit.com  www.stopsmok[place]upp[place].com  www.ffsonline.org

## 2014-08-26 LAB — REFERENCE LAB SAMPLE

## 2015-08-29 ENCOUNTER — Encounter: Attending: Internal Medicine

## 2015-09-05 MED ORDER — HYDROCHLOROTHIAZIDE 25 MG PO TABS
25 MG | ORAL_TABLET | Freq: Every day | ORAL | 0 refills | Status: DC
Start: 2015-09-05 — End: 2015-09-07

## 2015-09-05 MED ORDER — AMLODIPINE BESYLATE 10 MG PO TABS
10 MG | ORAL_TABLET | ORAL | 0 refills | Status: DC
Start: 2015-09-05 — End: 2015-09-07

## 2015-09-05 NOTE — Telephone Encounter (Signed)
PT. CALLED TO REQUEST Rx REFILL ON     amLODIPine (NORVASC) 10 MG tablet      AND     hydrochlorothiazide (MICROZIDE) 12.5 MG capsule       BOTH CALLED IN TO A NEW OUT OF TOWN PHARMACY.     [facility]   [phone]              PT. HAS RESCHEDULED A PHYSICAL APPOINTMENT FOR 5.28.17   BUT IS CURRENTLY LIVING OUT OF TOWN.    PT. CAN BE REACHED AT   [phone]

## 2015-09-07 MED ORDER — AMLODIPINE BESYLATE 10 MG PO TABS
10 | ORAL_TABLET | ORAL | 0 refills | 90.00000 days | Status: DC
Start: 2015-09-07 — End: 2015-11-06

## 2015-09-07 MED ORDER — HYDROCHLOROTHIAZIDE 25 MG PO TABS
25 | ORAL_TABLET | Freq: Every day | ORAL | 0 refills | Status: DC
Start: 2015-09-07 — End: 2015-11-06

## 2015-09-07 NOTE — Telephone Encounter (Signed)
PT CALLING SAYING SHE REQUESTED HCTZ AND AMLODIPINE ON TUES, 09-05-15.  SHE SAYS AS OF LAST NIGHT  [facility] [phone]  HAS NOTHING FOR HER.  I TOLD HER WE SHOW A RECEIPT CONFIRMED BY PHARM.   SHE WOULD LIKE TO KNOW WHAT IS GOING ON.   SHE SAYS [facility] OPENS AT 9AM TODAY.   PLEASE LET HER KNOW

## 2015-09-07 NOTE — Telephone Encounter (Signed)
PT. JUST CALLED BACK  STATING THAT [facility] AND DOESN'T NEED ANYTHING ELSE.    FYI ONLY, NOT FURTHER ACTION REQUIRED.

## 2015-11-06 MED ORDER — AMLODIPINE BESYLATE 10 MG PO TABS
10 | ORAL_TABLET | ORAL | 0 refills | Status: DC
Start: 2015-11-06 — End: 2023-04-02

## 2015-11-06 MED ORDER — HYDROCHLOROTHIAZIDE 25 MG PO TABS
25 | ORAL_TABLET | Freq: Every day | ORAL | 0 refills | Status: DC
Start: 2015-11-06 — End: 2023-04-02

## 2015-11-06 NOTE — Telephone Encounter (Signed)
PT. CALLED REQUESTING Rx REFILL ON     --hydrochlorothiazide (HYDRODIURIL) 25 MG tablet       --amLODIPine (NORVASC) 10 MG tablet    [facility]   [phone]

## 2015-11-06 NOTE — Telephone Encounter (Signed)
Please contact patient  She is over due for office visit/annual  Please schedule a visit for her

## 2015-11-06 NOTE — Telephone Encounter (Signed)
Called and lmov asking patient to call and make a visit

## 2015-11-06 NOTE — Telephone Encounter (Signed)
Last ov 08/23/14 last refill 09/07/15

## 2015-12-01 ENCOUNTER — Encounter: Attending: Internal Medicine

## 2015-12-29 ENCOUNTER — Encounter (HOSPITAL_COMMUNITY): Payer: Self-pay | Admitting: *Deleted

## 2015-12-29 ENCOUNTER — Emergency Department (HOSPITAL_COMMUNITY)
Admission: EM | Admit: 2015-12-29 | Discharge: 2015-12-30 | Disposition: A | Discharge status: Home / Self Care | Payer: 59 | Attending: Emergency Medicine | Admitting: Emergency Medicine

## 2015-12-29 DIAGNOSIS — Z79899 Other long term (current) drug therapy: Secondary | ICD-10-CM | POA: Diagnosis not present

## 2015-12-29 DIAGNOSIS — R52 Pain, unspecified: Secondary | ICD-10-CM

## 2015-12-29 DIAGNOSIS — F17200 Nicotine dependence, unspecified, uncomplicated: Secondary | ICD-10-CM | POA: Insufficient documentation

## 2015-12-29 DIAGNOSIS — N390 Urinary tract infection, site not specified: Secondary | ICD-10-CM | POA: Insufficient documentation

## 2015-12-29 DIAGNOSIS — R509 Fever, unspecified: Secondary | ICD-10-CM | POA: Diagnosis present

## 2015-12-29 DIAGNOSIS — Z791 Long term (current) use of non-steroidal anti-inflammatories (NSAID): Secondary | ICD-10-CM | POA: Insufficient documentation

## 2015-12-29 LAB — CBC
HCT: 40 % (ref 36.0–46.0)
Hemoglobin: 13.3 g/dL (ref 12.0–15.0)
MCH: 28.4 pg (ref 26.0–34.0)
MCHC: 33.3 g/dL (ref 30.0–36.0)
MCV: 85.5 fL (ref 78.0–100.0)
Platelets: 263 10*3/uL (ref 150–400)
RBC: 4.68 MIL/uL (ref 3.87–5.11)
RDW: 14.9 % (ref 11.5–15.5)
WBC: 18.2 10*3/uL — AB (ref 4.0–10.5)

## 2015-12-29 LAB — COMPREHENSIVE METABOLIC PANEL
ALT: 21 U/L (ref 14–54)
AST: 25 U/L (ref 15–41)
Albumin: 4 g/dL (ref 3.5–5.0)
Alkaline Phosphatase: 60 U/L (ref 38–126)
Anion gap: 10 (ref 5–15)
BILIRUBIN TOTAL: 0.8 mg/dL (ref 0.3–1.2)
BUN: 11 mg/dL (ref 6–20)
CHLORIDE: 105 mmol/L (ref 101–111)
CO2: 22 mmol/L (ref 22–32)
CREATININE: 0.87 mg/dL (ref 0.44–1.00)
Calcium: 9.3 mg/dL (ref 8.9–10.3)
Glucose, Bld: 103 mg/dL — ABNORMAL HIGH (ref 65–99)
Potassium: 3.5 mmol/L (ref 3.5–5.1)
Sodium: 137 mmol/L (ref 135–145)
TOTAL PROTEIN: 8.5 g/dL — AB (ref 6.5–8.1)

## 2015-12-29 LAB — LIPASE, BLOOD: LIPASE: 26 U/L (ref 11–51)

## 2015-12-29 MED ORDER — FENTANYL CITRATE (PF) 100 MCG/2ML IJ SOLN
50.0000 ug | INTRAMUSCULAR | Status: DC | PRN
  Administered 2015-12-29: 50 ug via INTRAVENOUS
  Filled 2015-12-29 (×2): qty 2

## 2015-12-29 NOTE — ED Notes (Signed)
Pt complains of RLQ pain radiating to her right leg. Pt states the pain feels like something is "twisting and ripping" in her abdomen. Pt states she had a fever of 105 today, which came down after she took and ice back. Pt still has her appendix.

## 2015-12-29 NOTE — ED Notes (Signed)
Pt states she has already had "too much tylenol today"

## 2015-12-29 NOTE — ED Provider Notes (Signed)
CSN: [id number]     Arrival date & time 12/29/15  2237 History  By signing my name below, I, [facility], attest that this documentation has been prepared under the direction and in the presence of Abagail Limb, MD. Electronically Signed: Randell Patient, ED Scribe. 12/30/2015. 3:17 AM.  Chief Complaint  Patient presents with  . Abdominal Pain  . Fever   Patient is a 45 y.o. female presenting with fever. The history is provided by the patient. No language interpreter was used.  Fever Temp source:  Oral Severity:  Moderate Onset quality:  Gradual Timing:  Constant Progression:  Unchanged Chronicity:  New Relieved by:  Nothing Worsened by:  Nothing tried Ineffective treatments:  Acetaminophen Associated symptoms: nausea   Associated symptoms: no chills, no confusion, no congestion, no cough, no diarrhea, no dysuria, no headaches, no myalgias, no somnolence and no vomiting   Risk factors: no recent travel and no sick contacts   HPI Comments: Cassiopeia Florentino is a 45 y.o. female with no pertinent chronic conditions who presents to the Emergency Department complaining of constant, waxing and waning, moderate, gradually worsening RLQ abdominal pain onset 18 hours ago upon waking. Pt describes the pain as a twisting or ripping sensation in her abdomen and notes that it radiates straight through to her back. She reports associated fever and nausea. She has had normal BMs, most recently yesterday. She has taken pain medication in the ED with slight relief. LMP 2 weeks ago. Denies similar symptoms in the past. Denies recent travel or new foods. Denies vomiting, dysuria, hematuria, malodorous urine, diarrhea, constipation, or any other symptoms currently.    History reviewed. No pertinent past medical history. History reviewed. No pertinent past surgical history. No family history on file. Social History  Substance Use Topics  . Smoking status: Current Every Day Smoker  . Smokeless  tobacco: None  . Alcohol Use: Yes   OB History    No data available     Review of Systems  Constitutional: Positive for fever. Negative for chills, appetite change and fatigue.  HENT: Negative for congestion.   Respiratory: Negative for cough.   Gastrointestinal: Positive for nausea and abdominal pain. Negative for vomiting, diarrhea and constipation.  Genitourinary: Negative for dysuria, hematuria, vaginal bleeding and vaginal discharge.  Musculoskeletal: Negative for myalgias.  Neurological: Negative for headaches.  Psychiatric/Behavioral: Negative for confusion.  All other systems reviewed and are negative.     Allergies  Penicillins  Home Medications   Prior to Admission medications   Medication Sig Start Date End Date Taking? Authorizing Provider  acetaminophen (TYLENOL) 500 MG tablet Take 1,000 mg by mouth every 6 (six) hours as needed for moderate pain.   Yes Historical Provider, MD  amLODipine (NORVASC) 10 MG tablet Take 10 mg by mouth daily.   Yes Historical Provider, MD  Ascorbic Acid (VITAMIN C PO) Take 1 tablet by mouth daily.   Yes Historical Provider, MD  GARLIC PO Take 1 tablet by mouth daily.   Yes Historical Provider, MD  hydrochlorothiazide (HYDRODIURIL) 25 MG tablet Take 25 mg by mouth daily.   Yes Historical Provider, MD  Omega-3 Fatty Acids (FISH OIL PO) Take 1 tablet by mouth daily.   Yes Historical Provider, MD   BP 129/72 mmHg  Pulse 107  Temp(Src) 103.2 F (39.6 C) (Oral)  Resp 17  SpO2 100%  LMP 12/15/2015 Physical Exam  Constitutional: She is oriented to person, place, and time. She appears well-developed and well-nourished. No distress.  HENT:  Head: Normocephalic and atraumatic.  Mouth/Throat: Mucous membranes are normal.  Eyes: EOM are normal. Pupils are equal, round, and reactive to light.  Neck: Normal range of motion. Neck supple.  Cardiovascular: Normal rate and regular rhythm.   Regular rate and rhythm.  Pulmonary/Chest: Effort  normal and breath sounds normal. No respiratory distress.  Lungs CTA bilaterally.  Abdominal: Soft. Bowel sounds are normal. She exhibits no distension. There is no tenderness. There is no rigidity, no rebound, no guarding, no tenderness at McBurney's point and negative Murphy's sign.  Good bowel sounds. Abdomen soft without rebound or guarding.  Musculoskeletal: Normal range of motion.  Neurological: She is alert and oriented to person, place, and time. She has normal reflexes.  DTRs intact.  Skin: Skin is warm and dry. She is not diaphoretic.  Psychiatric: She has a normal mood and affect. Her behavior is normal.  Nursing note and vitals reviewed.   ED Course  Procedures (including critical care time)  DIAGNOSTIC STUDIES: Oxygen Saturation is 98% on RA, normal by my interpretation.    COORDINATION OF CARE: 12:54 AM Will order labs and renal stone study CT. Discussed treatment plan with pt at bedside and pt agreed to plan.  2:00 AM Reviewed results of labs. Ordered Tylenol, Cipro, Zofran, IV fluids, and Toradol.  Labs Review Labs Reviewed  COMPREHENSIVE METABOLIC PANEL - Abnormal; Notable for the following:    Glucose, Bld 103 (*)    Total Protein 8.5 (*)    All other components within normal limits  CBC - Abnormal; Notable for the following:    WBC 18.2 (*)    All other components within normal limits  URINALYSIS, ROUTINE W REFLEX MICROSCOPIC (NOT AT [facility]) - Abnormal; Notable for the following:    APPearance CLOUDY (*)    Hgb urine dipstick MODERATE (*)    Ketones, ur 15 (*)    Protein, ur 30 (*)    Leukocytes, UA LARGE (*)    All other components within normal limits  URINE MICROSCOPIC-ADD ON - Abnormal; Notable for the following:    Squamous Epithelial / LPF 0-5 (*)    Bacteria, UA RARE (*)    All other components within normal limits  ACETAMINOPHEN LEVEL - Abnormal; Notable for the following:    Acetaminophen (Tylenol), Serum <10 (*)    All other components within  normal limits  LIPASE, BLOOD  POC URINE PREG, ED  GC/CHLAMYDIA PROBE AMP ([place]) NOT AT [facility]    Imaging Review No results found. I have personally reviewed and evaluated these images and lab results as part of my medical decision-making.   EKG Interpretation None      MDM   Final diagnoses:  Pain   Filed Vitals:   12/30/15 0359 12/30/15 0410  BP:  105/66  Pulse:  91  Temp: 99 F (37.2 C)   Resp:  23   Results for orders placed or performed during the hospital encounter of 12/29/15  Lipase, blood  Result Value Ref Range   Lipase 26 11 - 51 U/L  Comprehensive metabolic panel  Result Value Ref Range   Sodium 137 135 - 145 mmol/L   Potassium 3.5 3.5 - 5.1 mmol/L   Chloride 105 101 - 111 mmol/L   CO2 22 22 - 32 mmol/L   Glucose, Bld 103 (H) 65 - 99 mg/dL   BUN 11 6 - 20 mg/dL   Creatinine, Ser 4.090.87 0.44 - 1.00 mg/dL   Calcium 9.3 8.9 - 81.110.3 mg/dL  Total Protein 8.5 (H) 6.5 - 8.1 g/dL   Albumin 4.0 3.5 - 5.0 g/dL   AST 25 15 - 41 U/L   ALT 21 14 - 54 U/L   Alkaline Phosphatase 60 38 - 126 U/L   Total Bilirubin 0.8 0.3 - 1.2 mg/dL   GFR calc non Af Amer >60 >60 mL/min   GFR calc Af Amer >60 >60 mL/min   Anion gap 10 5 - 15  CBC  Result Value Ref Range   WBC 18.2 (H) 4.0 - 10.5 K/uL   RBC 4.68 3.87 - 5.11 MIL/uL   Hemoglobin 13.3 12.0 - 15.0 g/dL   HCT 16.1 09.6 - 04.5 %   MCV 85.5 78.0 - 100.0 fL   MCH 28.4 26.0 - 34.0 pg   MCHC 33.3 30.0 - 36.0 g/dL   RDW 40.9 81.1 - 91.4 %   Platelets 263 150 - 400 K/uL  Urinalysis, Routine w reflex microscopic  Result Value Ref Range   Color, Urine YELLOW YELLOW   APPearance CLOUDY (A) CLEAR   Specific Gravity, Urine 1.021 1.005 - 1.030   pH 7.5 5.0 - 8.0   Glucose, UA NEGATIVE NEGATIVE mg/dL   Hgb urine dipstick MODERATE (A) NEGATIVE   Bilirubin Urine NEGATIVE NEGATIVE   Ketones, ur 15 (A) NEGATIVE mg/dL   Protein, ur 30 (A) NEGATIVE mg/dL   Nitrite NEGATIVE NEGATIVE   Leukocytes, UA LARGE (A) NEGATIVE   Urine microscopic-add on  Result Value Ref Range   Squamous Epithelial / LPF 0-5 (A) NONE SEEN   WBC, UA TOO NUMEROUS TO COUNT 0 - 5 WBC/hpf   RBC / HPF 6-30 0 - 5 RBC/hpf   Bacteria, UA RARE (A) NONE SEEN  Acetaminophen level  Result Value Ref Range   Acetaminophen (Tylenol), Serum <10 (L) 10 - 30 ug/mL  POC Urine Pregnancy, ED (do NOT order at [facility])  Result Value Ref Range   Preg Test, Ur NEGATIVE NEGATIVE   Ct Renal Stone Study  12/30/2015  CLINICAL DATA:  45 year old female with abdominal pain EXAM: CT ABDOMEN AND PELVIS WITHOUT CONTRAST TECHNIQUE: Multidetector CT imaging of the abdomen and pelvis was performed following the standard protocol without IV contrast. COMPARISON:  None. FINDINGS: Evaluation of this exam is limited in the absence of intravenous contrast. The visualized lung bases are clear. No intra-abdominal free air or free fluid. Mild diffuse fatty infiltration of the liver. The gallbladder, pancreas, spleen, adrenal glands, kidneys, visualized ureters, and urinary bladder appear unremarkable. The uterus and ovaries are grossly unremarkable. Evaluation of the bowel is limited in the absence of oral contrast. There is no evidence of bowel obstruction or active inflammation. Normal appendix. The abdominal aorta and IVC appear grossly unremarkable on this noncontrast study. No portal venous gas identified. There is no adenopathy. The abdominal wall soft tissues appear unremarkable. The osseous structures are intact. IMPRESSION: No acute intra-abdominal or pelvic pathology. Electronically Signed   By: Elgie Collard M.D.   On: 12/30/2015 03:26    Medications  fentaNYL (SUBLIMAZE) injection 50 mcg (50 mcg Intravenous Given 12/29/15 2315)  ketorolac (TORADOL) 30 MG/ML injection 30 mg (30 mg Intravenous Given 12/30/15 0208)  sodium chloride 0.9 % bolus 1,000 mL (0 mLs Intravenous Stopped 12/30/15 0350)  ondansetron (ZOFRAN) injection 4 mg (4 mg Intravenous Given 12/30/15 0208)   ciprofloxacin (CIPRO) IVPB 400 mg (0 mg Intravenous Stopped 12/30/15 0338)    Pain is markedly improved post medication and the patient is resting comfortably.  She is  tolerating PO in the ED and has no signs of pyelonephritis on CT.    No not take more tylenol that the bottle indicates.  Take all antibiotics and pain medication as directed.  Hydrate well with oral fluids.  Follow up in 3 days with your regular doctor for recheck.  Strict return precautions given.   Patient verbalize understanding and agrees to follow up  I personally performed the services described in this documentation, which was scribed in my presence. The recorded information has been reviewed and is accurate.      Cy BlamerApril Kole Hilyard, MD 12/30/15 [phone]

## 2015-12-29 NOTE — ED Notes (Signed)
Bed: WA20 Expected date:  Expected time:  Means of arrival:  Comments: 

## 2015-12-30 ENCOUNTER — Emergency Department (HOSPITAL_COMMUNITY): Payer: 59

## 2015-12-30 ENCOUNTER — Encounter (HOSPITAL_COMMUNITY): Payer: Self-pay | Admitting: Emergency Medicine

## 2015-12-30 LAB — URINALYSIS, ROUTINE W REFLEX MICROSCOPIC
BILIRUBIN URINE: NEGATIVE
GLUCOSE, UA: NEGATIVE mg/dL
Ketones, ur: 15 mg/dL — AB
Nitrite: NEGATIVE
Protein, ur: 30 mg/dL — AB
SPECIFIC GRAVITY, URINE: 1.021 (ref 1.005–1.030)
pH: 7.5 (ref 5.0–8.0)

## 2015-12-30 LAB — URINE MICROSCOPIC-ADD ON

## 2015-12-30 LAB — POC URINE PREG, ED: PREG TEST UR: NEGATIVE

## 2015-12-30 LAB — ACETAMINOPHEN LEVEL

## 2015-12-30 MED ORDER — KETOROLAC TROMETHAMINE 30 MG/ML IJ SOLN
30.0000 mg | Freq: Once | INTRAMUSCULAR | Status: AC
  Administered 2015-12-30: 30 mg via INTRAVENOUS
  Filled 2015-12-30: qty 1

## 2015-12-30 MED ORDER — NITROFURANTOIN MONOHYD MACRO 100 MG PO CAPS: 100.0000 mg | ORAL_CAPSULE | Freq: Two times a day (BID) | ORAL | Status: DC

## 2015-12-30 MED ORDER — CIPROFLOXACIN IN D5W 400 MG/200ML IV SOLN
400.0000 mg | Freq: Once | INTRAVENOUS | Status: AC
  Administered 2015-12-30: 400 mg via INTRAVENOUS
  Filled 2015-12-30: qty 200

## 2015-12-30 MED ORDER — ACETAMINOPHEN 500 MG PO TABS
1000.0000 mg | ORAL_TABLET | Freq: Once | ORAL | Status: DC
  Filled 2015-12-30: qty 2

## 2015-12-30 MED ORDER — ONDANSETRON 8 MG PO TBDP: ORAL_TABLET | ORAL | Status: DC

## 2015-12-30 MED ORDER — PHENAZOPYRIDINE HCL 200 MG PO TABS: 200.0000 mg | ORAL_TABLET | Freq: Three times a day (TID) | ORAL | Status: DC

## 2015-12-30 MED ORDER — SODIUM CHLORIDE 0.9 % IV BOLUS (SEPSIS)
1000.0000 mL | Freq: Once | INTRAVENOUS | Status: AC
  Administered 2015-12-30: 1000 mL via INTRAVENOUS

## 2015-12-30 MED ORDER — DICLOFENAC SODIUM ER 100 MG PO TB24: 100.0000 mg | ORAL_TABLET | Freq: Every day | ORAL | Status: DC

## 2015-12-30 MED ORDER — ONDANSETRON HCL 4 MG/2ML IJ SOLN
4.0000 mg | Freq: Once | INTRAMUSCULAR | Status: AC
  Administered 2015-12-30: 4 mg via INTRAVENOUS
  Filled 2015-12-30: qty 2

## 2021-03-28 ENCOUNTER — Emergency Department (HOSPITAL_COMMUNITY)
Admission: EM | Admit: 2021-03-28 | Discharge: 2021-03-28 | Disposition: A | Discharge status: Home / Self Care | Payer: 59 | Attending: Emergency Medicine | Admitting: Emergency Medicine

## 2021-03-28 ENCOUNTER — Emergency Department (HOSPITAL_COMMUNITY): Payer: 59

## 2021-03-28 DIAGNOSIS — Z7982 Long term (current) use of aspirin: Secondary | ICD-10-CM | POA: Diagnosis not present

## 2021-03-28 DIAGNOSIS — F17200 Nicotine dependence, unspecified, uncomplicated: Secondary | ICD-10-CM | POA: Insufficient documentation

## 2021-03-28 DIAGNOSIS — R2990 Unspecified symptoms and signs involving the nervous system: Secondary | ICD-10-CM

## 2021-03-28 DIAGNOSIS — I10 Essential (primary) hypertension: Secondary | ICD-10-CM

## 2021-03-28 DIAGNOSIS — I639 Cerebral infarction, unspecified: Secondary | ICD-10-CM | POA: Insufficient documentation

## 2021-03-28 DIAGNOSIS — R519 Headache, unspecified: Secondary | ICD-10-CM | POA: Insufficient documentation

## 2021-03-28 DIAGNOSIS — R531 Weakness: Secondary | ICD-10-CM | POA: Diagnosis present

## 2021-03-28 LAB — COMPREHENSIVE METABOLIC PANEL
ALT: 13 U/L (ref 0–44)
AST: 21 U/L (ref 15–41)
Albumin: 3.4 g/dL — ABNORMAL LOW (ref 3.5–5.0)
Alkaline Phosphatase: 68 U/L (ref 38–126)
Anion gap: 15 (ref 5–15)
BUN: 6 mg/dL (ref 6–20)
CO2: 21 mmol/L — ABNORMAL LOW (ref 22–32)
Calcium: 8.6 mg/dL — ABNORMAL LOW (ref 8.9–10.3)
Chloride: 105 mmol/L (ref 98–111)
Creatinine, Ser: 0.73 mg/dL (ref 0.44–1.00)
GFR, Estimated: >60 mL/min (ref >60)
Glucose, Bld: 94 mg/dL (ref 70–99)
Potassium: 3.7 mmol/L (ref 3.5–5.1)
Sodium: 141 mmol/L (ref 135–145)
Total Bilirubin: 0.7 mg/dL (ref 0.3–1.2)
Total Protein: 6.8 g/dL (ref 6.5–8.1)

## 2021-03-28 LAB — DIFFERENTIAL
Abs Immature Granulocytes: 0.02 10*3/uL (ref 0.00–0.07)
Basophils Absolute: 0 10*3/uL (ref 0.0–0.1)
Basophils Relative: 0 %
Eosinophils Absolute: 0.1 10*3/uL (ref 0.0–0.5)
Eosinophils Relative: 1 %
Immature Granulocytes: 0 %
Lymphocytes Relative: 40 %
Lymphs Abs: 3.3 10*3/uL (ref 0.7–4.0)
Monocytes Absolute: 0.5 10*3/uL (ref 0.1–1.0)
Monocytes Relative: 6 %
Neutro Abs: 4.3 10*3/uL (ref 1.7–7.7)
Neutrophils Relative %: 53 %

## 2021-03-28 LAB — CBG MONITORING, ED: Glucose-Capillary: 93 mg/dL (ref 70–99)

## 2021-03-28 LAB — CBC
HCT: 43.3 % (ref 36.0–46.0)
Hemoglobin: 14 g/dL (ref 12.0–15.0)
MCH: 27.7 pg (ref 26.0–34.0)
MCHC: 32.3 g/dL (ref 30.0–36.0)
MCV: 85.6 fL (ref 80.0–100.0)
Platelets: 301 10*3/uL (ref 150–400)
RBC: 5.06 MIL/uL (ref 3.87–5.11)
RDW: 15.8 % — ABNORMAL HIGH (ref 11.5–15.5)
WBC: 8.2 10*3/uL (ref 4.0–10.5)
nRBC: 0 % (ref 0.0–0.2)

## 2021-03-28 LAB — I-STAT CHEM 8, ED
BUN: 7 mg/dL (ref 6–20)
Calcium, Ion: 0.62 mmol/L — CL (ref 1.15–1.40)
Chloride: 107 mmol/L (ref 98–111)
Creatinine, Ser: 0.6 mg/dL (ref 0.44–1.00)
Glucose, Bld: 91 mg/dL (ref 70–99)
HCT: 44 % (ref 36.0–46.0)
Hemoglobin: 15 g/dL (ref 12.0–15.0)
Potassium: 3.6 mmol/L (ref 3.5–5.1)
Sodium: 143 mmol/L (ref 135–145)
TCO2: 20 mmol/L — ABNORMAL LOW (ref 22–32)

## 2021-03-28 LAB — PROTIME-INR
INR: 0.9 (ref 0.8–1.2)
Prothrombin Time: 12.3 seconds (ref 11.4–15.2)

## 2021-03-28 LAB — I-STAT BETA HCG BLOOD, ED (MC, WL, AP ONLY): I-stat hCG, quantitative: <5 m[IU]/mL (ref <5)

## 2021-03-28 LAB — APTT: aPTT: 31 seconds (ref 24–36)

## 2021-03-28 MED ORDER — LORAZEPAM 2 MG/ML IJ SOLN
1.0000 mg | Freq: Once | INTRAMUSCULAR | Status: AC
  Administered 2021-03-28: 1 mg via INTRAVENOUS

## 2021-03-28 MED ORDER — LISINOPRIL 5 MG PO TABS: 5.0000 mg | ORAL_TABLET | Freq: Every day | ORAL | 0 refills | Status: AC

## 2021-03-28 MED ORDER — SODIUM CHLORIDE 0.9% FLUSH
3.0000 mL | Freq: Once | INTRAVENOUS | Status: AC
  Administered 2021-03-28: 3 mL via INTRAVENOUS

## 2021-03-28 MED ORDER — LORAZEPAM 2 MG/ML IJ SOLN
INTRAMUSCULAR | Status: AC
  Filled 2021-03-28: qty 1

## 2021-03-28 MED ORDER — LORAZEPAM 2 MG/ML IJ SOLN
2.0000 mg | Freq: Once | INTRAMUSCULAR | Status: AC
  Administered 2021-03-28: 2 mg via INTRAVENOUS
  Filled 2021-03-28: qty 1

## 2021-03-28 NOTE — ED Triage Notes (Addendum)
Charted on patient in error.  

## 2021-03-28 NOTE — ED Provider Notes (Signed)
[facility] EMERGENCY DEPARTMENT Provider Note   CSN: [id number] Arrival date & time: 03/28/21  1139     History No chief complaint on file.   Denise Robles is a 50 y.o. female.  HPI Presents as a code stroke.  Case conducted, discussed with our neurology colleagues, after patient arrived to the ED. Patient has multiple medical issues.  She presents today due to new left arm weakness.  Onset was about 90 minutes prior to ED arrival.  There was reportedly facial droop as well.  No reported speech deficit.  On arrival the patient complains of headache as well, moderate.  Per EMS the patient was hypertensive in route, CBG 125.  Patient states that she had been well prior to this.    No past medical history on file.  There are no problems to display for this patient.   No past surgical history on file.   OB History   No obstetric history on file.     No family history on file.  Social History   Tobacco Use   Smoking status: Every Day  Substance Use Topics   Alcohol use: Yes    Home Medications Prior to Admission medications   Medication Sig Start Date End Date Taking? Authorizing Provider  APPLE CIDER VINEGAR PO Take 15 mLs by mouth daily.   Yes [provider]  aspirin EC 81 MG tablet Take 81 mg by mouth daily. Swallow whole.   Yes [provider]  ELDERBERRY PO Take 15 mLs by mouth daily.   Yes [provider]  GARLIC PO Take 2 tablets by mouth daily.   Yes [provider]  NON FORMULARY Take 15 mLs by mouth daily. Sea Moss   Yes [provider]  OVER THE COUNTER MEDICATION Take 15 mLs by mouth daily. Burdock Root   Yes [provider]  TURMERIC PO Take 15 mLs by mouth daily.   Yes [provider]  Diclofenac Sodium CR (VOLTAREN-XR) 100 MG 24 hr tablet Take 1 tablet (100 mg total) by mouth daily. Patient not taking: No sig reported 12/30/15   Palumbo, April, MD  nitrofurantoin,  macrocrystal-monohydrate, (MACROBID) 100 MG capsule Take 1 capsule (100 mg total) by mouth 2 (two) times daily. X 7 days Patient not taking: No sig reported 12/30/15   Palumbo, April, MD  ondansetron (ZOFRAN ODT) 8 MG disintegrating tablet 8mg  ODT q8 hours prn nausea Patient not taking: Reported on 03/28/2021 12/30/15   Palumbo, April, MD  phenazopyridine (PYRIDIUM) 200 MG tablet Take 1 tablet (200 mg total) by mouth 3 (three) times daily. Patient not taking: Reported on 03/28/2021 12/30/15   Palumbo, April, MD    Allergies    Penicillins  Review of Systems   Review of Systems  Constitutional:        Per HPI, otherwise negative  HENT:         Per HPI, otherwise negative  Respiratory:         Per HPI, otherwise negative  Cardiovascular:        Per HPI, otherwise negative  Gastrointestinal:  Negative for vomiting.  Endocrine:       Negative aside from HPI  Genitourinary:        Neg aside from HPI   Musculoskeletal:        Per HPI, otherwise negative  Skin: Negative.   Neurological:  Positive for weakness. Negative for syncope.   Physical Exam Updated Vital Signs BP (!) 157/80  Pulse 69   Resp 18   SpO2 100%   Physical Exam Vitals and nursing note reviewed.  Constitutional:      General: She is not in acute distress.    Appearance: She is well-developed.  HENT:     Head: Normocephalic and atraumatic.  Eyes:     Conjunctiva/sclera: Conjunctivae normal.  Cardiovascular:     Rate and Rhythm: Normal rate and regular rhythm.  Pulmonary:     Effort: Pulmonary effort is normal. No respiratory distress.     Breath sounds: Normal breath sounds. No stridor.  Abdominal:     General: There is no distension.  Skin:    General: Skin is warm and dry.  Neurological:     Mental Status: She is alert and oriented to person, place, and time.     Cranial Nerves: Cranial nerves are intact. No cranial nerve deficit.     Comments: Neuro exam inconsistent.  Initially the patient has  difficulty holding her left arm against gravity, but when painful stimuli is applied to the right arm for phlebotomy patient reaches across her body with her left arm.  No gross facial asymmetry, speech is brief, but clear.    ED Results / Procedures / Treatments   Labs (all labs ordered are listed, but only abnormal results are displayed) Labs Reviewed  CBC - Abnormal; Notable for the following components:      Result Value   RDW 15.8 (*)    All other components within normal limits  COMPREHENSIVE METABOLIC PANEL - Abnormal; Notable for the following components:   CO2 21 (*)    Calcium 8.6 (*)    Albumin 3.4 (*)    All other components within normal limits  I-STAT CHEM 8, ED - Abnormal; Notable for the following components:   Calcium, Ion 0.62 (*)    TCO2 20 (*)    All other components within normal limits  PROTIME-INR  APTT  DIFFERENTIAL  RAPID URINE DRUG SCREEN, HOSP PERFORMED  CBG MONITORING, ED  I-STAT BETA HCG BLOOD, ED ([facility], [facility], [facility] ONLY)    EKG EKG Interpretation  Date/Time:  Wednesday March 28 2021 14:03:25 EDT Ventricular Rate:  72 PR Interval:  173 QRS Duration: 90 QT Interval:  425 QTC Calculation: 466 R Axis:   75 Text Interpretation: Sinus rhythm Consider left ventricular hypertrophy Abnormal ECG Confirmed by Gerhard Munch [phone]) on 03/28/2021 3:42:13 PM  Radiology CT HEAD CODE STROKE WO CONTRAST  Result Date: 03/28/2021 CLINICAL DATA:  Code stroke. Neuro deficit, acute, stroke suspected. EXAM: CT HEAD WITHOUT CONTRAST TECHNIQUE: Contiguous axial images were obtained from the base of the skull through the vertex without intravenous contrast. COMPARISON:  None. FINDINGS: Brain: No acute infarct, hemorrhage, or mass lesion is present. The ventricles are of normal size. No significant extraaxial fluid collection is present. The brainstem and cerebellum are within normal limits. Vascular: No hyperdense vessel or unexpected calcification. Skull: Calvarium is  intact. No focal lytic or blastic lesions are present. No significant extracranial soft tissue lesion is present. Sinuses/Orbits: The paranasal sinuses and mastoid air cells are clear. The globes and orbits are within normal limits. ASPECTS [facility] Stroke Program Early CT Score) - Ganglionic level infarction (caudate, lentiform nuclei, internal capsule, insula, M1-M3 cortex): 7/7 - Supraganglionic infarction (M4-M6 cortex): 3/3 Total score (0-10 with 10 being normal): 10/10 IMPRESSION: 1. Negative CT of the head. 2. ASPECTS is 10/10. The above was relayed via text pager to Dr. Wilford Corner on 03/28/2021 at 11:59 . Electronically  Signed   By: Marin Roberts M.D.   On: 03/28/2021 11:59    Procedures Procedures   Medications Ordered in ED Medications  LORazepam (ATIVAN) 2 MG/ML injection (  See Procedure Record 03/28/21 1244)  LORazepam (ATIVAN) injection 2 mg (has no administration in time range)  sodium chloride flush (NS) 0.9 % injection 3 mL (3 mLs Intravenous Given 03/28/21 1254)  LORazepam (ATIVAN) injection 1 mg (1 mg Intravenous Given 03/28/21 1206)  LORazepam (ATIVAN) injection 1 mg (1 mg Intravenous Given 03/28/21 1155)    ED Course  I have reviewed the triage vital signs and the nursing notes.  Pertinent labs & imaging results that were available during my care of the patient were reviewed by me and considered in my medical decision making (see chart for details).  Cardiac 70s sinus normal Pulse ox 100% room air normal Initial head CT reviewed, discussed with neurology.  No evidence for acute stroke, the patient's neurologic phenomena have largely resolved.  Patient was intolerant of initial attempt at MRI.  Update:, Labs unremarkable.  Patient on repeat exam is in no distress, is now moving her left arm freely.  We discussed neurology recommendation for additional attempted MRI, but should other labs results within normal limits, she remain back to baseline, she may be appropriate for  discharge with outpatient follow-up.  Patient will attempt MRI again, notes that she may not tolerate this in spite of Ativan provision, may require follow-up in the neurology clinic.  3:47 PM Speak normal, patient using telephone without complication.  She notes that she has substantial ongoing life stressors, including upcoming surgery for her child, and utility issues at home.  We discussed options for further evaluation, and she reiterates she will try MRI here, but is comfortable with following up with neurology clinic as an outpatient should that test not be feasible.  On sign-out, MRI is pending. MDM Rules/Calculators/A&P MDM Number of Diagnoses or Management Options Weakness: new, needed workup   Amount and/or Complexity of Data Reviewed Clinical lab tests: ordered and reviewed Tests in the radiology section of CPT: ordered and reviewed Tests in the medicine section of CPT: reviewed and ordered Decide to obtain previous medical records or to obtain history from someone other than the patient: yes Obtain history from someone other than the patient: yes Review and summarize past medical records: yes Discuss the patient with other providers: yes Independent visualization of images, tracings, or specimens: yes  Risk of Complications, Morbidity, and/or Mortality Presenting problems: high Diagnostic procedures: high Management options: high  Critical Care Total time providing critical care: < 30 minutes  Patient Progress Patient progress: stable   Final Clinical Impression(s) / ED Diagnoses Final diagnoses:  Weakness    Rx / DC Orders ED Discharge Orders     None        Gerhard Munch, MD 03/28/21 1551

## 2021-03-28 NOTE — ED Notes (Signed)
Lab results given to Nurse. 

## 2021-03-28 NOTE — ED Triage Notes (Addendum)
Charted on patient in error.  

## 2021-03-28 NOTE — ED Notes (Signed)
Pt needs stroke swallow screen, charted complete in error

## 2021-03-28 NOTE — ED Notes (Signed)
Patient transported to MRI 

## 2021-03-28 NOTE — ED Notes (Signed)
Lab results given to Nurse. 

## 2021-03-28 NOTE — Consult Note (Signed)
Neurology consult   CC: code stroke.  History is obtained from: EMS, chart.   HPI: Ms Teresa Gregory is a 50 yo female with a PMHx of HTN who presents as a code stroke. Per EMS, patient was her normal self upon awakening today. Then at 1020, had acute onset of left sided weakness and facial droop. She called 911 herself. Upon presentation, she c/o HA 8/10. BP 190/100 and CBG 125 en route. BP down to 160s here.   After brief exam on the ED bridge and for airway clearance, patient was taken emergently to CT suite. CTH showed no acute finding. Patient was taken to MRI b but even after ATivan 1mg  IV xc 2 doses, patient would not cooperate with test.   TNK not given due to fluctuating symptoms and suspicion for conversation reaction.   Per chart, patient was on HCTZ and Norvasc, but she states she had a fight with her PCP and he stopped prescribing meds for her (sounds like she was missing OVs).   Only one ED visit in Epic for abdominal pain 2017. No notes in Care Everywhere.   Patient is crying, stating she does not have time for this right now and that her baby is having surgery next week. She yells that she knows that this is her anxiety.   LKW: 1020   hours TNK given?: No, fluctuating symptoms.  IR Thrombectomy?: No, no suspicion for LVO.  MRS: 0  NIHSS:  1a Level of Consciousness: 0 1b LOC Questions: 0 1c LOC Commands: 0 2 Best Gaze: 0 3 Visual: 0 4 Facial Palsy: 0 5a Motor Arm - left: 1 5b Motor Arm - Right: 0 6a Motor Leg - Left: 1 6b Motor Leg - Right: 0 7 Limb Ataxia: 0 8 Sensory: 0 9 Best Language: 0 10 Dysarthria: 0 11 Extinction and Inattention: 0 TOTAL:  2  ROS: A robust ROS was unable to be performed due to emergent nature of event.   PMHx: HTN.  No family history on file.  Social History:  reports that she has been smoking. She does not have any smokeless tobacco history on file. She reports current alcohol use. No history on file for drug use.   Prior to  Admission medications   Medication Sig Start Date End Date Taking? Authorizing Provider  acetaminophen (TYLENOL) 500 MG tablet Take 1,000 mg by mouth every 6 (six) hours as needed for moderate pain.    [provider]  amLODipine (NORVASC) 10 MG tablet Take 10 mg by mouth daily.    [provider]  Ascorbic Acid (VITAMIN C PO) Take 1 tablet by mouth daily.    [provider]  Diclofenac Sodium CR (VOLTAREN-XR) 100 MG 24 hr tablet Take 1 tablet (100 mg total) by mouth daily. 12/30/15   Palumbo, April, MD  GARLIC PO Take 1 tablet by mouth daily.    [provider]  hydrochlorothiazide (HYDRODIURIL) 25 MG tablet Take 25 mg by mouth daily.    [provider]  nitrofurantoin, macrocrystal-monohydrate, (MACROBID) 100 MG capsule Take 1 capsule (100 mg total) by mouth 2 (two) times daily. X 7 days 12/30/15   Palumbo, April, MD  Omega-3 Fatty Acids (FISH OIL PO) Take 1 tablet by mouth daily.    [provider]  ondansetron (ZOFRAN ODT) 8 MG disintegrating tablet 8mg  ODT q8 hours prn nausea 12/30/15   Palumbo, April, MD  phenazopyridine (PYRIDIUM) 200 MG tablet Take 1 tablet (200 mg total) by mouth 3 (three)  times daily. 12/30/15   Palumbo, April, MD    Exam: Current vital signs: BP 160/97.  HR 54.  RR 18.  SaO2 100% on RA.   Physical Exam  Constitutional: Appears well-developed and well-nourished.  Psych: Tearful, states she knows this is from her anxiety.  Eyes: No scleral injection. HENT: No OP obstruction. Head: Normocephalic.  Cardiovascular: Normal rate and regular rhythm.  Respiratory: Effort normal.  GI: Abdomen soft.  No distension. There is no tenderness.  Skin: WDI.  Neuro: Mental Status: Patient is awake, alert, oriented to person, place, month, year, and situation. Patient is able to give a clear and coherent history. No signs of neglect. Speech/Language:  Speech is clear, fluent without dysarthria or aphasia. Repetition, naming,  and comprehension intact.  Cranial Nerves: II: Visual Fields are full. Pupils are equal, round, and reactive to light.  III,IV, VI: EOMI without ptosis or diploplia.  V: Facial sensation is symmetric to light touch in V1, V2, and V3. VII: Facial movement symmetrical.  VIII: hearing is intact to voice. X: Uvula elevates symmetrically. XI: Shoulder shrug is symmetric. XII: tongue is midline without atrophy or fasciculations.  Motor: RUE 5/5.  RLE 5/5.  LUE grip 4+/5. Bicep 4/5 with give away weakness. Triceps no effort, just rolls her eyes.  RLE 5/5.  LLE Able to lift about 4 inches, but falls back to bed.  Sensation is symmetric to light touch in all fours extremities.  Vibratory sense is less on the right on the forehead. Extinction absent to DSS.  Plantars: Toes are downgoing bilaterally.  Cerebellar: No ataxia noted with FNF. Unable to perform HKS.    I have reviewed labs in epic and the pertinent results are: INR  .9       aPTT   31      creatinine  .6    Istat HCG < 5.   NCT head  -Negative CT of the head. ASPECTS is 10/10.  Assessment: 50 yo female with stroke risk factors of uncontrolled HTN and non adherence with medications. Her exam waxes and wanes. CTH negative for acute finding. TNK not given due to waxing and waning symptoms. She is very tearful and states she knows this is her anxiety. She was medicated for a total of Ativan 2mg  IV prior to MRI brain.   Impression:  -Likely, functional presentation due to admitted anxiety.  -Doubt stroke or TIA.  -Uncontrolled HTN.  -Non adherence.   Plan: -MRI brain--Unable to complete even after medication.  -serial trops/EKG per ED due to c/o chest pain.   -Needs a new PCP to manage her BP.   Patient seen by , MSN, APN-BC, nurse practitioner and by MD. Note/plan to be edited by MD as needed.  Pager: [phone]   Attending addendum Patient seen and examined for concern for strokelike symptoms with  left-sided weakness and the facial droop Last known well 10:20 AM today. Patient called EMS herself for concerns for weakness. History and physical performed independently On my examination she has effort dependent left-sided weakness and Hoover sign on the left as well as splitting the sensation in midline with splitting vibratory sensation on the forehead. Exam appears to be functional [facility] personally reviewed. No acute findings. Given her history and risk factors, was taken for stat MRI which in spite of 2 doses of 1 mg Ativan IV, she could not tolerate. She started hyperventilating and then complaining of chest discomfort. Her exam had improved to the point where  she is able to move the left side without any prompting at this time. I suspect an underlying psychological etiology and less likely to be stroke. Management of chest pain per ED. No further neurological work-up at this time if symptoms are resolved.  If symptoms of left-sided weakness continue, can consider another trial of MRI. Not a candidate for tPA due to functional symptoms. Exam not suggestive of LVO to consider endovascular thrombectomy Plan was discussed with the ED provider  -- Milon Dikes, MD Neurologist [facility] Pager: [phone]

## 2021-03-28 NOTE — Discharge Instructions (Addendum)
As discussed, your evaluation today has been largely reassuring.  But, it is important that you monitor your condition carefully, and do not hesitate to return to the ED if you develop new, or concerning changes in your condition.  You should discuss today's evaluation with your physician and our neurology colleagues to consider appropriate outpatient studies as needed.  MRI normal - no signs of stroke  Start lisinopril for your blood pressure, 1 tablet daily

## 2021-03-28 NOTE — Code Documentation (Signed)
Stroke Response Nurse Documentation Code Documentation  Sameria Gregory is a 50 y.o. female arriving to [facility] ED via [facility] on 03/28/21 with past medical hx of HTN,and Anxiety. On No antithrombotic. Code stroke was activated by [facility]- EMS.   Patient from home where she was LKW at 1020 and now complaining of left sided weakness, and facial droop. Upon arrival patient also complains of a headache and anxiety.  Stroke team at the bedside on patient arrival. Labs drawn and patient cleared for CT by Dr. Jeraldine Loots. Patient to CT with team. NIHSS 2, see documentation for details and code stroke times. Patient with left arm weakness and left leg weakness on exam. The following imaging was completed:  CT. Patient is not a candidate for IV Thrombolytic or IR due to "fluctuating symptoms and suspicion for conversation reaction"   STAT MRI attempted with 2mg  Ativan but patient is tearful  and complaining of anxiety.   Care/Plan: q2 NIHSS and vital signs and MRI if patient able.   Bedside handoff with , ED RN .    Benetta Spar  Stroke Response RN

## 2021-03-28 NOTE — Progress Notes (Signed)
MRI with no evidence of stroke. No further inpatient neurological work-up  -- Milon Dikes, MD Neurologist [facility] Pager: [phone]

## 2021-03-28 NOTE — ED Provider Notes (Signed)
[facility] EMERGENCY DEPARTMENT Provider Note   CSN: [id number] Arrival date & time: 03/28/21  1139     History No chief complaint on file.   Mirca Yale is a 50 y.o. female.  HPI Presents as a code stroke.  Case conducted, discussed with our neurology colleagues, after patient arrived to the ED. Patient has multiple medical issues.  She presents today due to new left arm weakness.  Onset was about 90 minutes prior to ED arrival.  There was reportedly facial droop as well.  No reported speech deficit.  On arrival the patient complains of headache as well, moderate.  Per EMS the patient was hypertensive in route, CBG 125.  Patient states that she had been well prior to this.    No past medical history on file.  There are no problems to display for this patient.   No past surgical history on file.   OB History   No obstetric history on file.     No family history on file.  Social History   Tobacco Use   Smoking status: Every Day  Substance Use Topics   Alcohol use: Yes    Home Medications Prior to Admission medications   Medication Sig Start Date End Date Taking? Authorizing Provider  APPLE CIDER VINEGAR PO Take 15 mLs by mouth daily.   Yes [provider]  aspirin EC 81 MG tablet Take 81 mg by mouth daily. Swallow whole.   Yes [provider]  ELDERBERRY PO Take 15 mLs by mouth daily.   Yes [provider]  GARLIC PO Take 2 tablets by mouth daily.   Yes [provider]  NON FORMULARY Take 15 mLs by mouth daily. Sea Moss   Yes [provider]  OVER THE COUNTER MEDICATION Take 15 mLs by mouth daily. Burdock Root   Yes [provider]  TURMERIC PO Take 15 mLs by mouth daily.   Yes [provider]  Diclofenac Sodium CR (VOLTAREN-XR) 100 MG 24 hr tablet Take 1 tablet (100 mg total) by mouth daily. Patient not taking: No sig reported 12/30/15   Palumbo, April, MD  nitrofurantoin,  macrocrystal-monohydrate, (MACROBID) 100 MG capsule Take 1 capsule (100 mg total) by mouth 2 (two) times daily. X 7 days Patient not taking: No sig reported 12/30/15   Palumbo, April, MD  ondansetron (ZOFRAN ODT) 8 MG disintegrating tablet 8mg  ODT q8 hours prn nausea Patient not taking: Reported on 03/28/2021 12/30/15   Palumbo, April, MD  phenazopyridine (PYRIDIUM) 200 MG tablet Take 1 tablet (200 mg total) by mouth 3 (three) times daily. Patient not taking: Reported on 03/28/2021 12/30/15   Palumbo, April, MD    Allergies    Penicillins  Review of Systems   Review of Systems  Constitutional:        Per HPI, otherwise negative  HENT:         Per HPI, otherwise negative  Respiratory:         Per HPI, otherwise negative  Cardiovascular:        Per HPI, otherwise negative  Gastrointestinal:  Negative for vomiting.  Endocrine:       Negative aside from HPI  Genitourinary:        Neg aside from HPI   Musculoskeletal:        Per HPI, otherwise negative  Skin: Negative.   Neurological:  Positive for weakness. Negative for syncope.   Physical Exam Updated Vital Signs BP (!) 157/80  Pulse 69   Resp 18   SpO2 100%   Physical Exam Vitals and nursing note reviewed.  Constitutional:      General: She is not in acute distress.    Appearance: She is well-developed.  HENT:     Head: Normocephalic and atraumatic.  Eyes:     Conjunctiva/sclera: Conjunctivae normal.  Cardiovascular:     Rate and Rhythm: Normal rate and regular rhythm.  Pulmonary:     Effort: Pulmonary effort is normal. No respiratory distress.     Breath sounds: Normal breath sounds. No stridor.  Abdominal:     General: There is no distension.  Skin:    General: Skin is warm and dry.  Neurological:     Mental Status: She is alert and oriented to person, place, and time.     Cranial Nerves: Cranial nerves are intact. No cranial nerve deficit.     Comments: Neuro exam inconsistent.  Initially the patient has  difficulty holding her left arm against gravity, but when painful stimuli is applied to the right arm for phlebotomy patient reaches across her body with her left arm.  No gross facial asymmetry, speech is brief, but clear.    ED Results / Procedures / Treatments   Labs (all labs ordered are listed, but only abnormal results are displayed) Labs Reviewed  CBC - Abnormal; Notable for the following components:      Result Value   RDW 15.8 (*)    All other components within normal limits  COMPREHENSIVE METABOLIC PANEL - Abnormal; Notable for the following components:   CO2 21 (*)    Calcium 8.6 (*)    Albumin 3.4 (*)    All other components within normal limits  I-STAT CHEM 8, ED - Abnormal; Notable for the following components:   Calcium, Ion 0.62 (*)    TCO2 20 (*)    All other components within normal limits  PROTIME-INR  APTT  DIFFERENTIAL  RAPID URINE DRUG SCREEN, HOSP PERFORMED  CBG MONITORING, ED  I-STAT BETA HCG BLOOD, ED ([facility], [facility], [facility] ONLY)    EKG EKG Interpretation  Date/Time:  Wednesday March 28 2021 14:03:25 EDT Ventricular Rate:  72 PR Interval:  173 QRS Duration: 90 QT Interval:  425 QTC Calculation: 466 R Axis:   75 Text Interpretation: Sinus rhythm Consider left ventricular hypertrophy Abnormal ECG Confirmed by Gerhard Munch [phone]) on 03/28/2021 3:42:13 PM  Radiology CT HEAD CODE STROKE WO CONTRAST  Result Date: 03/28/2021 CLINICAL DATA:  Code stroke. Neuro deficit, acute, stroke suspected. EXAM: CT HEAD WITHOUT CONTRAST TECHNIQUE: Contiguous axial images were obtained from the base of the skull through the vertex without intravenous contrast. COMPARISON:  None. FINDINGS: Brain: No acute infarct, hemorrhage, or mass lesion is present. The ventricles are of normal size. No significant extraaxial fluid collection is present. The brainstem and cerebellum are within normal limits. Vascular: No hyperdense vessel or unexpected calcification. Skull: Calvarium is  intact. No focal lytic or blastic lesions are present. No significant extracranial soft tissue lesion is present. Sinuses/Orbits: The paranasal sinuses and mastoid air cells are clear. The globes and orbits are within normal limits. ASPECTS [facility] Stroke Program Early CT Score) - Ganglionic level infarction (caudate, lentiform nuclei, internal capsule, insula, M1-M3 cortex): 7/7 - Supraganglionic infarction (M4-M6 cortex): 3/3 Total score (0-10 with 10 being normal): 10/10 IMPRESSION: 1. Negative CT of the head. 2. ASPECTS is 10/10. The above was relayed via text pager to Dr. Wilford Corner on 03/28/2021 at 11:59 . Electronically  Signed   By: Marin Roberts M.D.   On: 03/28/2021 11:59    Procedures Procedures   Medications Ordered in ED Medications  LORazepam (ATIVAN) 2 MG/ML injection (  See Procedure Record 03/28/21 1244)  LORazepam (ATIVAN) injection 2 mg (has no administration in time range)  sodium chloride flush (NS) 0.9 % injection 3 mL (3 mLs Intravenous Given 03/28/21 1254)  LORazepam (ATIVAN) injection 1 mg (1 mg Intravenous Given 03/28/21 1206)  LORazepam (ATIVAN) injection 1 mg (1 mg Intravenous Given 03/28/21 1155)    ED Course  I have reviewed the triage vital signs and the nursing notes.  Pertinent labs & imaging results that were available during my care of the patient were reviewed by me and considered in my medical decision making (see chart for details).  Cardiac 70s sinus normal Pulse ox 100% room air normal Initial head CT reviewed, discussed with neurology.  No evidence for acute stroke, the patient's neurologic phenomena have largely resolved.  Patient was intolerant of initial attempt at MRI.  Update:, Labs unremarkable.  Patient on repeat exam is in no distress, is now moving her left arm freely.  We discussed neurology recommendation for additional attempted MRI, but should other labs results within normal limits, she remain back to baseline, she may be appropriate for  discharge with outpatient follow-up.  Patient will attempt MRI again, notes that she may not tolerate this in spite of Ativan provision, may require follow-up in the neurology clinic.  3:47 PM Speak normal, patient using telephone without complication.  She notes that she has substantial ongoing life stressors, including upcoming surgery for her child, and utility issues at home.  We discussed options for further evaluation, and she reiterates she will try MRI here, but is comfortable with following up with neurology clinic as an outpatient should that test not be feasible.  On sign-out, MRI is pending. MDM Rules/Calculators/A&P MDM Number of Diagnoses or Management Options Weakness: new, needed workup   Amount and/or Complexity of Data Reviewed Clinical lab tests: ordered and reviewed Tests in the radiology section of CPT: ordered and reviewed Tests in the medicine section of CPT: reviewed and ordered Decide to obtain previous medical records or to obtain history from someone other than the patient: yes Obtain history from someone other than the patient: yes Review and summarize past medical records: yes Discuss the patient with other providers: yes Independent visualization of images, tracings, or specimens: yes  Risk of Complications, Morbidity, and/or Mortality Presenting problems: high Diagnostic procedures: high Management options: high  Critical Care Total time providing critical care: < 30 minutes  Patient Progress Patient progress: stable   Final Clinical Impression(s) / ED Diagnoses Final diagnoses:  Weakness    Rx / DC Orders ED Discharge Orders     None        Gerhard Munch, MD 03/28/21 1551

## 2021-03-28 NOTE — ED Provider Notes (Signed)
Pt evaluated MRI is normal, the patient's labs are otherwise largely unremarkable.  Her blood pressure is elevated and she was offered lisinopril which she has excepted.  At this time patient is stable for discharge, she is agreeable to follow-up with her primary care physician.   Eber Hong, MD 03/28/21 [phone]

## 2022-01-16 IMAGING — MR MR HEAD W/O CM
9 of 10 series · 41 of 48 positions shown · non-contrast
Comparison: Head CT earlier same day.

CLINICAL DATA: Neuro deficit, acute, stroke suspected.

EXAM:
MRI HEAD WITHOUT CONTRAST
TECHNIQUE: Multiplanar, multiecho pulse sequences of the brain and surrounding
structures were obtained without intravenous contrast.

[Series 5: DWI · axial · 3.0mm · 0.88mm/px · z∈[-51,+90]mm · 9 of 96 slices shown (1 of 4)]
[im 1/96]
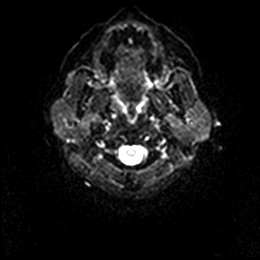
[im 12/96]
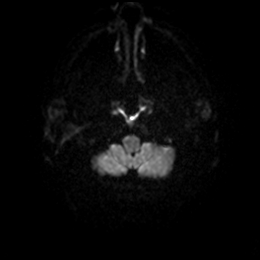
[im 24/96]
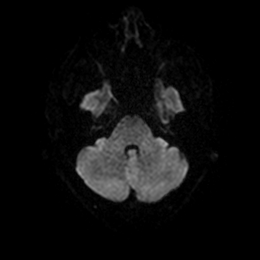
[im 36/96]
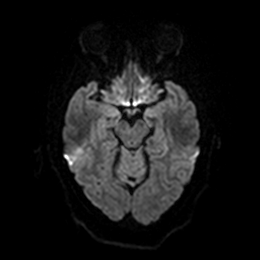
[im 48/96]
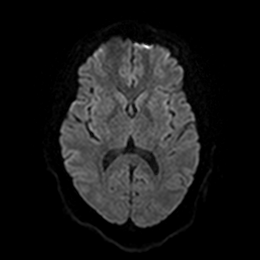
[im 60/96]
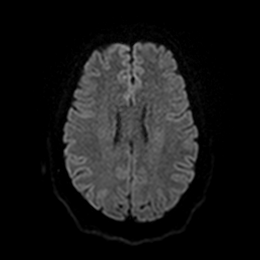
[im 72/96]
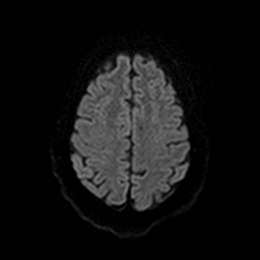
[im 84/96]
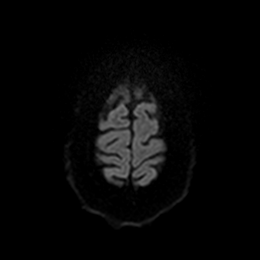
[im 96/96]
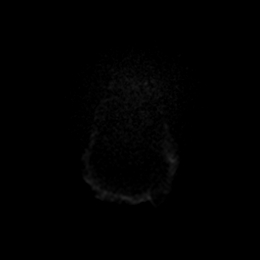

[Series 6: DWI · axial · 3.0mm · 0.88mm/px · z∈[-51,+90]mm · 5 of 48 slices shown (2 of 4)]
[im 1/48]
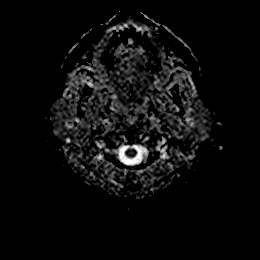
[im 12/48]
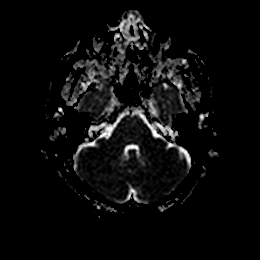
[im 24/48]
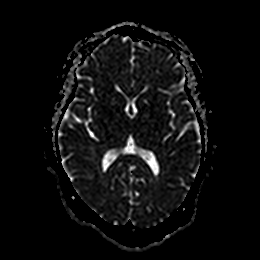
[im 36/48]
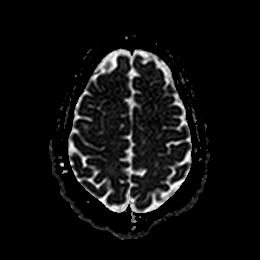
[im 48/48]
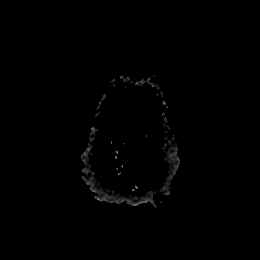

[Series 7: DWI · coronal · 4.0mm · 0.88mm/px · 8 of 72 slices shown (3 of 4)]
[im 1/72]
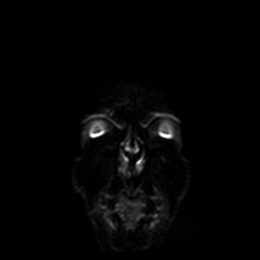
[im 11/72]
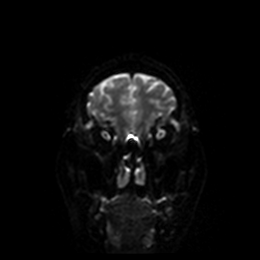
[im 21/72]
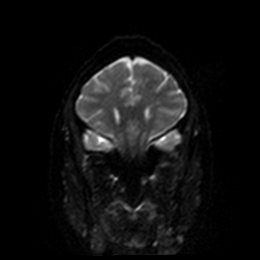
[im 31/72]
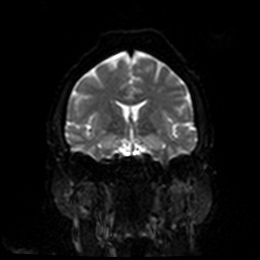
[im 41/72]
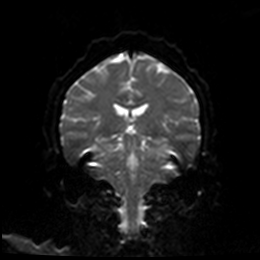
[im 51/72]
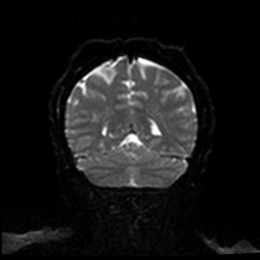
[im 61/72]
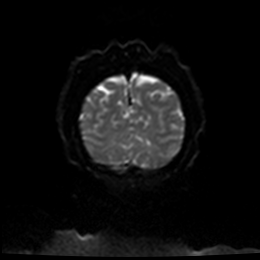
[im 72/72]
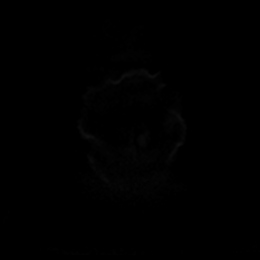

[Series 8: DWI · coronal · 4.0mm · 0.88mm/px · 4 of 36 slices shown (4 of 4)]
[im 1/36]
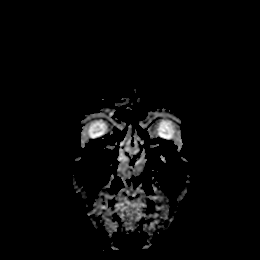
[im 12/36]
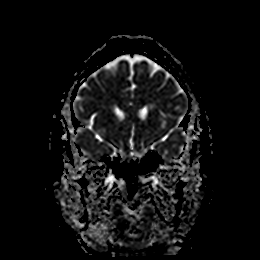
[im 24/36]
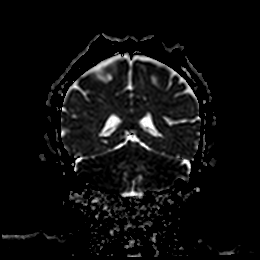
[im 36/36]
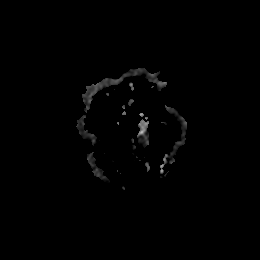

[Series 9: FLAIR · axial · 5.0mm · 0.90mm/px · z∈[-61,+83]mm · 3 of 25 slices shown]
[im 1/25]
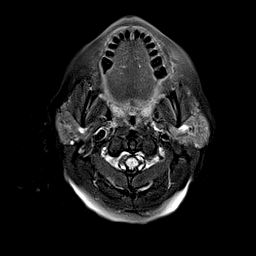
[im 13/25]
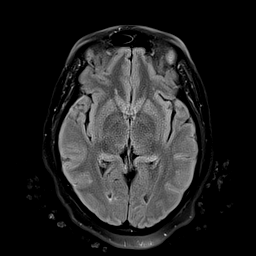
[im 25/25]
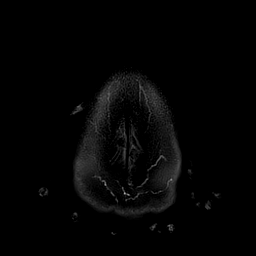

[Series 10: ax hemo · axial · 5.0mm · 0.86mm/px · z∈[-62,+82]mm · 3 of 25 slices shown]
[im 1/25]
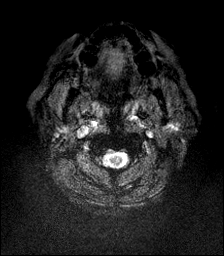
[im 13/25]
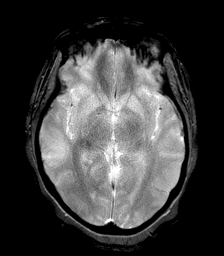
[im 25/25]
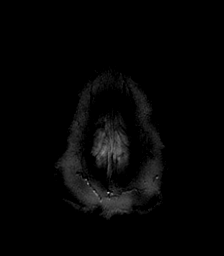

[Series 11: T2 · axial · 5.0mm · 0.72mm/px · z∈[-57,+86]mm · 3 of 25 slices shown (1 of 2)]
[im 1/25]
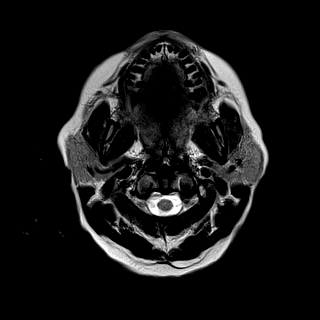
[im 13/25]
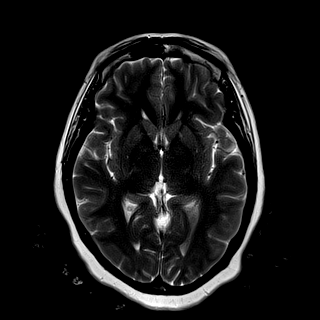
[im 25/25]
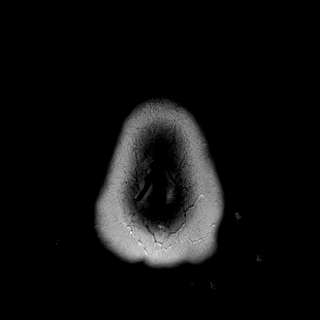

[Series 12: T1 · sagittal · 5.0mm · 0.75mm/px · 3 of 23 slices shown]
[im 1/23]
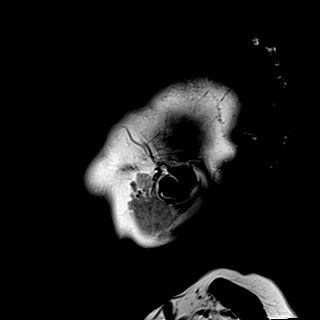
[im 12/23]
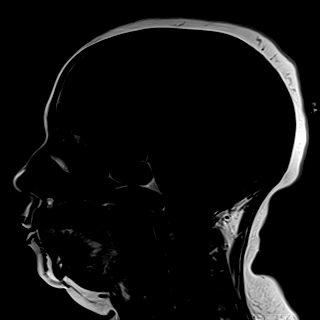
[im 23/23]
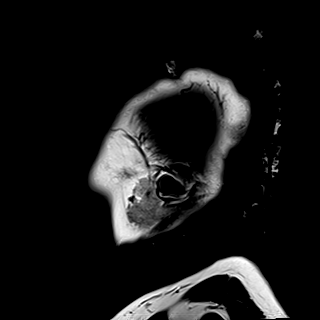

[Series 14: T2 · coronal · 5.0mm · 0.34mm/px · 3 of 29 slices shown (2 of 2)]
[im 1/29]
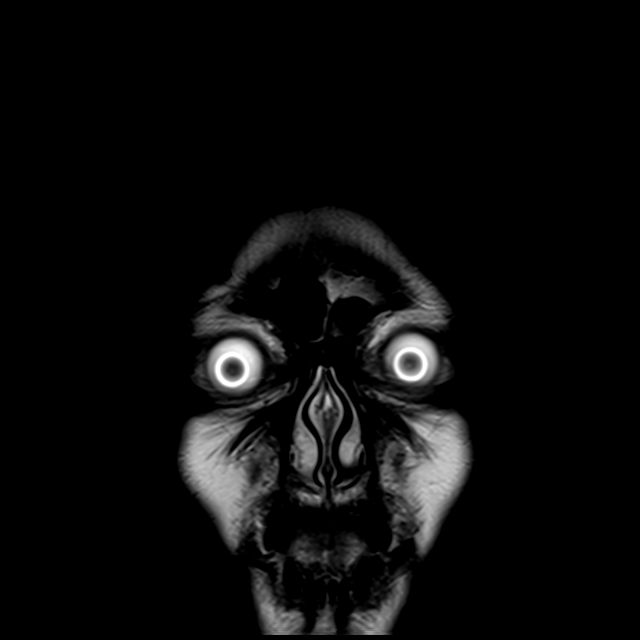
[im 15/29]
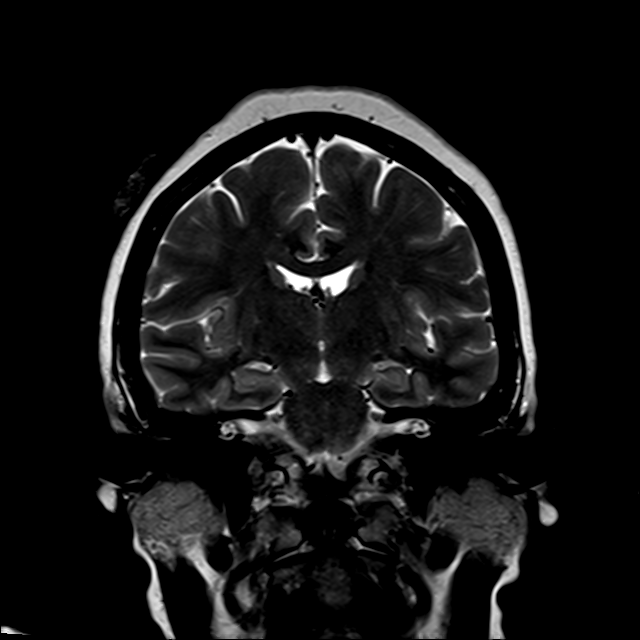
[im 29/29]
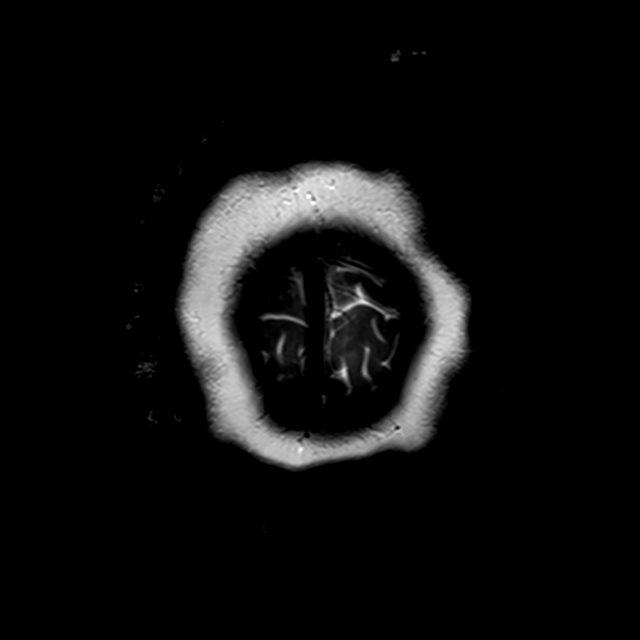

[41 of 48 positions shown; findings below may reference images not displayed]

FINDINGS: Brain: Diffusion imaging does not show any acute or subacute
infarction. No abnormality affects the brainstem or cerebellum.
Cerebral hemispheres show a few scattered foci of T2 and FLAIR
signal within the white matter likely reflecting an early
manifestation of small vessel change. No cortical or large vessel
territory infarction. No mass lesion, hemorrhage, hydrocephalus or
extra-axial collection.

Vascular: Major vessels at the base of the brain show flow.

Skull and upper cervical spine: Negative

Sinuses/Orbits: Clear/normal

Other: None
IMPRESSION: No acute finding by MRI. Scattered punctate foci of T2 and FLAIR
signal within the hemispheric white matter, likely reflecting an
early manifestation of small vessel change.

## 2022-12-31 DIAGNOSIS — R1031 Right lower quadrant pain: Secondary | ICD-10-CM

## 2022-12-31 DIAGNOSIS — R109 Unspecified abdominal pain: Secondary | ICD-10-CM

## 2022-12-31 MED ORDER — morphine injection 4 mg
4 | Freq: Once | INTRAVENOUS | Status: AC
Start: 2022-12-31 — End: 2023-01-01
  Administered 2023-01-01: 04:00:00 4 mg via INTRAVENOUS

## 2022-12-31 MED ORDER — lactated ringers IV bolus 1,000 mL
Freq: Once | INTRAVENOUS | Status: AC
Start: 2022-12-31 — End: 2022-12-31
  Administered 2023-01-01: 02:00:00 1000 mL via INTRAVENOUS

## 2022-12-31 MED ORDER — ondansetron (ZOFRAN) injection 4 mg
4 | Freq: Once | INTRAMUSCULAR | Status: AC
Start: 2022-12-31 — End: 2022-12-31
  Administered 2023-01-01: 02:00:00 4 mg via INTRAVENOUS

## 2022-12-31 MED ORDER — ketorolac (TORADOL) injection 15 mg
15 | Freq: Once | INTRAMUSCULAR | Status: AC
Start: 2022-12-31 — End: 2022-12-31
  Administered 2023-01-01: 02:00:00 15 mg via INTRAVENOUS

## 2022-12-31 MED ORDER — morphine injection 4 mg
4 | Freq: Once | INTRAVENOUS | Status: AC
Start: 2022-12-31 — End: 2022-12-31
  Administered 2023-01-01: 02:00:00 4 mg via INTRAVENOUS

## 2022-12-31 MED ORDER — acetaminophen (TYLENOL) tablet 975 mg
325 | Freq: Once | ORAL | Status: AC
Start: 2022-12-31 — End: 2022-12-31
  Administered 2023-01-01: 02:00:00 975 mg via ORAL

## 2022-12-31 NOTE — ED Notes (Signed)
Bed: B07U  Expected date:   Expected time:   Means of arrival:   Comments:  EMS

## 2022-12-31 NOTE — ED Notes (Addendum)
additional yellow tube urine with pt label sent to lab with red/yellow and gray top

## 2022-12-31 NOTE — ED Triage Notes (Signed)
Pt presenting to ED with severe flank and lower abdominal pain. Pt says she was recently seen for kidsney stones, feels one is moving down inside her while crying out in pain. Pt in severe distress and crying. Pain 10/10

## 2022-12-31 NOTE — ED Provider Notes (Incomplete)
Denise Robles ED Note    Date of Service: 12/31/2022  Reason for Visit: Flank Pain      Patient History     HPI  Denise Robles is a 52 y.o. female with a history of HTN, renal stones (right) who presents to the ED for evaluation of flank pain.  Patient is reporting 3 days of right flank pain that is radiating around to the front as well as subjective fevers and nausea.  Denies vomiting, chest pain, shortness of breath, vaginal bleeding or vaginal discharge, diarrhea.  She recently returned from [place] about 3 days ago after living there for about a year and has not established with health care here.  Was living in [place] prior to living in [place] for the last year.  She states while she was living in [place] she experienced kidney stones on the right side last July which were managed conservatively.  States she had a recurrence about 5 months later with an infection and at which time it was recommended to have some type of medical intervention to manage it however patient elected not to do it at that time.  She believes she passed one of the stones here in the emergency department and is mostly reporting right-sided groin pain and right flank pain.  She states some dysuria and urine is darker in color, no frank hematuria    No past medical history on file.  Past Surgical History:   Procedure Laterality Date    CESAREAN SECTION      DIAGNOSTIC LAPAROSCOPY  2007    endometriosis, endometerosis removal       Physical Exam     Vitals:    12/31/22 2103 12/31/22 2210   BP: (!) 180/105 145/66   BP Location: Left upper arm Right upper arm   Patient Position: Sitting Lying   Pulse: 100 72   Resp: 18 18   Temp: 97.8 F (36.6 C) 98.4 F (36.9 C)   TempSrc: Axillary Oral   SpO2: 100% 100%       General:  Well appearing.  Uncomfortable appearing, nontoxic appearing  Eyes:  EOMI. No discharge from eyes.  ENT:  tolerating oral secretions  Pulmonary:   Non-labored breathing. Breath sounds clear bilaterally.   Cardiac:   Regular rate and rhythm.   Abdomen:  Soft. Non-distended. Right lower quadrant abdominal pain, right CVA tenderness.  Abdomen is nonperitonitic  Musculoskeletal:  No long bone deformity. No peripheral edema  Skin:  Dry, no rashes  Neuro:  Alert and oriented x4. CN II-XII intact. Moves all four extremities to command.  Ambulatory without difficulty    Diagnostic Studies     Labs:  Please see EMR for labs obtained during this patient encounter     Radiology:  Please see EMR for images obtained during this patient encounter       ED Course and MDM     ORLANDO ANSARA is a 52 y.o. female with a history and presentation as described above in HPI.  The patient was evaluated by myself and the ED Attending Physician, Dr. Orvan Falconer. All management and disposition plans were discussed and agreed upon.    Initial vital signs in triage notable for afebrile, hypertensive to 180/105 mildly tachycardic to 100 satting 100% on room air.  Vital signs when roomed in B7 notable for afebrile, improved blood pressure to 145/66, improved heart rate of 72 and satting 100% on room air.  Physical exam notable for right lower quadrant tenderness  and right CVA tenderness, patient is nontoxic-appearing.  She received Toradol 15 mg, Tylenol 975 mg p.o., Zofran 4 mg, morphine 4 mg during initial triage process.    Labs ordered in MSE notable for CBC without a leukocytosis, hemoglobin and platelet count are normal.  Her BMP is notable for no significant electrolyte derangements, normal kidney function with a creatinine of 0.81.  Her LFTs are normal.  UA needs to be collected.    Highest concern for renal/ureteral lithiasis, will obtain CT abdomen pelvis also to rule out obstruction and infection concomitantly.    Medications received during this ED visit:  Medications   lactated ringers IV bolus 1,000 mL (1,000 mLs Intravenous New Bag 12/31/22 2214)   ondansetron (ZOFRAN) injection 4 mg (4 mg Intravenous Given 12/31/22 2159)   ketorolac (TORADOL)  injection 15 mg (15 mg Intravenous Given 12/31/22 2158)   acetaminophen (TYLENOL) tablet 975 mg (975 mg Oral Given 12/31/22 2158)   morphine injection 4 mg (4 mg Intravenous Given 12/31/22 2159)         MDM     Impression     No diagnosis found.     Plan     ***    Elita Quick, MD  [facility]

## 2022-12-31 NOTE — Other (Signed)
[facility]  MEDICAL SCREENING EXAM    Date of Service: 12/31/2022    Reason for Visit: Flank Pain      MSE Plan     Patient evaluated from the lobby for a medical screening exam.  In short, this is a patient presenting with flank pain. To further evaluate their complaints, the following orders have been placed:     Labs Reviewed - No data to display  No orders to display     No results found for this or any previous visit (from the past 4160 hour(s)).    Stable for lobby while awaiting ED bed. See primary provider's note for full details and final disposition.      Brief HPI     Denise Robles is a 52 y.o. female presenting with concerns of flank pain.  Patient states that she has a history of kidney stones, has been having left flank pain for the past 3 days, now suprapubically, fevers and vomiting as well.  States that she passed a kidney stone today, but still having severe pain.Marland Kitchen    Pertinent Physical Exam     ED Triage Vitals [12/31/22 2103]   Enc Vitals Group      BP (!) 180/105      Heart Rate 100      Resp 18      Temp 97.8 F (36.6 C)      Temp Source Axillary      SpO2 100 %      Weight       Height       Head Circumference       Peak Flow       Pain Score       Pain Loc       Pain Education       Exclude from Growth Chart      Physical Exam    Vitals:    12/31/22 2103   BP: (!) 180/105   BP Location: Left upper arm   Patient Position: Sitting   Pulse: 100   Resp: 18   Temp: 97.8 F (36.6 C)   TempSrc: Axillary   SpO2: 100%     General:  Severe distress, in pain  Eyes: EOMI. Pupils equal and symmetric to light, no conjunctival pallor  ENT:  Moist mucous membranes dry. Oropharyhnx without erythema or swelling.  Neck:  Supple, trachea midline  Pulmonary:  Lung sounds are clear to auscultation bilaterally. No wheezing, crackles, or rhonchi.   Cardiac:  Tachycardic. No murmurs, rubs, or gallop  Abdomen:  Soft. TTP to suprapubic region and RLQ. Non-distended. No guarding. Right CVA  tenderness  Musculoskeletal:  No long bone deformity. No peripheral edema.  Vascular:  Extremities warm and perfused. Normal pulses in all 4 extremities  Skin: Dry and intact. No rashes noted  Neuro:  Alert. Moves all four extremities to command. Equal strength bilaterally, no focal deficits.      Patient History     Medical History: No past medical history on file.  Surgical History:   Past Surgical History:   Procedure Laterality Date    CESAREAN SECTION      DIAGNOSTIC LAPAROSCOPY  2007    endometriosis, endometerosis removal      Medications: No current facility-administered medications for this encounter.    Current Outpatient Medications:     amitriptyline (ELAVIL) 25 MG tablet, Take 25 mg by mouth at bedtime., Disp: , Rfl:  CYCLOBENZAPRINE HCL (FLEXERIL ORAL), Take by mouth., Disp: , Rfl:     IBUPROFEN ORAL, Take 1 tablet by mouth every 8 hours., Disp: , Rfl:   Allergies:   Allergies   Allergen Reactions    Penicillins        Rethel Sebek D. Giovonnie Trettel

## 2023-01-01 ENCOUNTER — Inpatient Hospital Stay: Admit: 2023-01-01 | Discharge: 2023-01-01 | Disposition: A | Discharge status: Home / Self Care

## 2023-01-01 ENCOUNTER — Emergency Department: Admit: 2023-01-01

## 2023-01-01 LAB — BASIC METABOLIC PANEL
Anion Gap: 12 mmol/L (ref 3–16)
BUN: 12 mg/dL (ref 7–25)
CO2: 26 mmol/L (ref 21–33)
Calcium: 9.5 mg/dL (ref 8.6–10.3)
Chloride: 107 mmol/L (ref 98–110)
Creatinine: 0.81 mg/dL (ref 0.60–1.30)
EGFR: 86
Glucose: 86 mg/dL (ref 70–100)
Osmolality, Calculated: 299 mOsm/kg (ref 278–305)
Potassium: 4 mmol/L (ref 3.5–5.3)
Sodium: 145 mmol/L (ref 133–146)

## 2023-01-01 LAB — URINE CULTURE: Organism 2: <1000

## 2023-01-01 LAB — DIFFERENTIAL
Basophils Absolute: 110 /uL (ref 0–200)
Basophils Relative: 1.2 % — ABNORMAL HIGH (ref 0.0–1.0)
Eosinophils Absolute: 230 /uL (ref 15–500)
Eosinophils Relative: 2.5 % (ref 0.0–8.0)
Lymphocytes Absolute: 3459 /uL (ref 850–3900)
Lymphocytes Relative: 37.6 % (ref 15.0–45.0)
Monocytes Absolute: 460 /uL (ref 200–950)
Monocytes Relative: 5 % (ref 0.0–12.0)
Neutrophils Absolute: 4940 /uL (ref 1500–7800)
Neutrophils Relative: 53.7 % (ref 40.0–80.0)
nRBC: 0 /100 WBC (ref 0–0)

## 2023-01-01 LAB — HEPATIC FUNCTION PANEL
ALT: 10 U/L (ref 7–52)
AST: 15 U/L (ref 13–39)
Albumin: 4 g/dL (ref 3.5–5.7)
Alkaline Phosphatase: 95 U/L (ref 36–125)
Bilirubin, Direct: 0.1 mg/dL (ref 0.00–0.40)
Bilirubin, Indirect: 0.1 mg/dL (ref 0.00–1.10)
Total Bilirubin: 0.2 mg/dL (ref 0.0–1.5)
Total Protein: 7.7 g/dL (ref 6.4–8.9)

## 2023-01-01 LAB — CBC
Hematocrit: 39.1 % (ref 35.0–45.0)
Hemoglobin: 13.1 g/dL (ref 11.7–15.5)
MCH: 27.4 pg (ref 27.0–33.0)
MCHC: 33.5 g/dL (ref 32.0–36.0)
MCV: 81.9 fL (ref 80.0–100.0)
MPV: 8 fL (ref 7.5–11.5)
Platelets: 331 10*3/uL (ref 140–400)
RBC: 4.77 10*6/uL (ref 3.80–5.10)
RDW: 15.1 % — ABNORMAL HIGH (ref 11.0–15.0)
WBC: 9.2 10*3/uL (ref 3.8–10.8)

## 2023-01-01 LAB — URINALYSIS-MACROSCOPIC W/REFLEX TO MICROSCOPIC
Bilirubin, UA: NEGATIVE
Blood, UA: NEGATIVE
Glucose, UA: NEGATIVE mg/dL
Ketones, UA: NEGATIVE mg/dL
Nitrite, UA: NEGATIVE
Protein, UA: NEGATIVE mg/dL
Specific Gravity, UA: 1.013 (ref 1.005–1.035)
Urobilinogen, UA: <2 mg/dL (ref 0.2–1.9)
pH, UA: 7.5 (ref 5.0–8.0)

## 2023-01-01 LAB — URINALYSIS, MICROSCOPIC
RBC, UA: 4 /HPF — ABNORMAL HIGH (ref 0–3)
Squam Epithel, UA: 1 /HPF (ref 0–5)
WBC, UA: 7 /HPF — ABNORMAL HIGH (ref 0–5)

## 2023-01-01 LAB — ED HCV AB REFLEX TO HCV QUANT
HCV Ab: NONREACTIVE
HCVAB Number: 0.16 S/CO (ref 0.00–0.79)

## 2023-01-01 MED ORDER — LORazepam (ATIVAN) injection 0.5 mg
2 | Freq: Once | INTRAMUSCULAR | Status: AC
Start: 2023-01-01 — End: 2023-01-01
  Administered 2023-01-01: 05:00:00 0.5 mg via INTRAVENOUS

## 2023-01-01 MED ORDER — OMNIPAQUE (iohexol) 350 mg iodine/mL 130 mL
350 | Freq: Once | INTRAVENOUS | Status: AC | PRN
Start: 2023-01-01 — End: 2023-01-01
  Administered 2023-01-01: 05:00:00 130 mL via INTRAVENOUS

## 2023-01-01 MED FILL — KETOROLAC 15 MG/ML INJECTION SOLUTION: 15 15 mg/mL | INTRAMUSCULAR | Qty: 1

## 2023-01-01 MED FILL — MORPHINE 4 MG/ML INTRAVENOUS SOLUTION: 4 4 mg/mL | INTRAVENOUS | Qty: 1

## 2023-01-01 MED FILL — ONDANSETRON HCL (PF) 4 MG/2 ML INJECTION SOLUTION: 4 4 mg/2 mL | INTRAMUSCULAR | Qty: 2

## 2023-01-01 MED FILL — LORAZEPAM 2 MG/ML INJECTION SOLUTION: 2 2 mg/mL | INTRAMUSCULAR | Qty: 1

## 2023-01-01 MED FILL — TYLENOL 325 MG TABLET: 325 325 mg | ORAL | Qty: 3

## 2023-01-01 MED FILL — OMNIPAQUE 350 MG IODINE/ML INTRAVENOUS SOLUTION: 350 350 mg iodine/mL | INTRAVENOUS | Qty: 130

## 2023-01-01 NOTE — Discharge Instructions (Addendum)
You were seen in the emergency department today for right-sided flank pain and it is possible that you have passed a kidney stone.  Your urinalysis did not show obvious signs of infection however there is evidence of white blood cells which can indicate inflammation or possible infection so we sent a urine culture and the pharmacist will call to prescribe antibiotics if it is positive for bacterial infection.  Otherwise your pain should resolve over the next few days.  Take ibuprofen 600 mg every 6 hours for pain and/or Tylenol 650mg  every 6 hours for pain and inflammation.  We have also sent a referral for urology for you to follow-up with as an outpatient.  For urology appointments, please call [phone].  We have given you the information above for the internal medicine [facility] to establish primary care.  We have also given you a handout for local clinics with sliding scale fees.     The patient's imaging was notable for a incidental finding.  Repeat imaging is recommended in 6 months.  5 cm left adnexal cyst, nonspecific but likely benign ovarian cyst. Follow-up with nonemergent pelvic ultrasound could be helpful for better characterization. Alternatively follow-up imaging in 6 months to document resolution or stability could be considered.

## 2023-01-01 NOTE — ED Notes (Signed)
Call down to CT to administer ativan to help facilitate CT scan.

## 2023-01-01 NOTE — ED Provider Notes (Signed)
ED Attending Attestation Note    Date of service:  12/31/2022    This patient was seen by the resident physician.  I have seen and examined the patient, agree with the workup, evaluation, management and diagnosis. The care plan has been discussed and I concur.     My assessment reveals a 52 y.o. female with history of kidney stones presents complaining of right flank pain.  Patient states pain started 3 days ago.  She states she just returned to [place] from [place] and came to the emergency room for care.  On arrival, patient is mildly hypertensive but otherwise hemodynamically stable.  She does have right-sided CVA tenderness on exam.  Will check laboratory studies, imaging.

## 2023-01-01 NOTE — ED Notes (Signed)
Pt to CT

## 2023-03-03 ENCOUNTER — Encounter

## 2023-04-02 ENCOUNTER — Ambulatory Visit: Admit: 2023-04-02 | Discharge: 2023-04-02 | Payer: MEDICAID | Attending: Internal Medicine | Primary: Internal Medicine

## 2023-04-02 VITALS — BP 138/90 | HR 82 | Temp 98.00000°F | Resp 14 | Ht 63.25 in | Wt 172.0 lb

## 2023-04-02 DIAGNOSIS — Z7689 Persons encountering health services in other specified circumstances: Secondary | ICD-10-CM

## 2023-04-02 LAB — POCT URINALYSIS DIPSTICK
Bilirubin, UA: NEGATIVE
Glucose, UA POC: NEGATIVE mg/dL
Ketones, UA: NEGATIVE mg/dL
Leukocytes, UA: NEGATIVE
Nitrite, UA: NEGATIVE
Protein, UA POC: NEGATIVE mg/dL
Spec Grav, UA: 1.01
Urobilinogen, UA: 0.2 mg/dL
pH, UA: 7.5

## 2023-04-02 LAB — POCT URINE PREGNANCY: Preg Test, Ur: NEGATIVE

## 2023-04-02 MED ORDER — AMLODIPINE BESYLATE 10 MG PO TABS
10 | ORAL_TABLET | ORAL | 0 refills | Status: DC
Start: 2023-04-02 — End: 2023-06-26

## 2023-04-02 NOTE — Patient Instructions (Signed)
TAKE MED AS ADVISED    DIET/ EXERCISE.    FOLLOW UP WITHIN 3 MONTHS / AS NEEDED    FOLLOW UP FOR FASTING LABS    [facility] Locations - No appointment necessary.  ? indicates the location is open Saturdays in addition to Monday through Friday.   Call your preferred location for test preparation, business hours and other information you need.   [employer] accepts [employer].  CENTRAL  EAST  WEST    ? [facility]   [address]   [address]   [place], [place] [address]    Ph: [phone]  [facility]   [address] [place], [place] [address]    Ph: [phone]   ? [facility]   [address],    [place], [place] [address]    Ph: [phone]     [facility]   [address]    [place], [place] [address]    Ph: [phone] ? [facility]   [address]    [place], [place] [address]   Ph: [phone]  ? [facility]   [address]   [place], [place] [address]    Ph: [phone]      [facility] [address]    [place], [place] [address]   Ph: [phone]    NORTH    ? [facility]   [address]   [place] [address]    Ph: [phone]  [facility]   [address]   [place], [place] [address]   Ph: [phone]  [facility]   [address]   [place], [address]    PH: [phone] [address] [facility]   [address]   [place], [place] [address]    Ph: [phone]  [address]  [place], [place] [address]  Ph: [phone]  [facility]   [address]    [place] [address]    Ph: [phone]

## 2023-04-02 NOTE — Progress Notes (Signed)
 SUBJECTIVE:  Betty Cox is a 52 y.o. female HERE FOR   Chief Complaint   Patient presents with    New Patient      PT HERE TO INITIATE CARE       LAST PCP: DR.NYASSI -  IN [place]    HTN - HAS HAD FOR YEARS. ON NORVASC  - OUT OF MED. BP NOTED. + DIET / EXERCISE COMPLIANCE. NO HEADACHE , OCC DIZZINESS  KIDNEY STONES - DIAGNOSED IN 2023. OCC PAIN - NONE NOW, DENIES GROSS HEMATURIA  ALLERGIC RHINITIS - NO NASAL CONGESTION, NO POSTNASAL DRAINAGE , NO SINUS PRESSURE, NO HA, NO SNEEZING, NO WATERY ITCHY EYES.  SMOKES CIGARS - CESSATION ADVISED  + MARIJUANA USE - CESSATION ADVISED  SCREENING LABS D/W PT      DENIES CP, No SOB, No PALPITATIONS, No COUGH, NO F/C  No ABD PAIN, No N/V, No DIARRHEA, No CONSTIPATION, No MELENA, No HEMATOCHEZIA.  No DYSURIA, No FREQ, No URGENCY, No HEMATURIA    PMH: REVIEWED AND UPDATED TODAY    PSH: REVIEWED AND UPDATED TODAY    SOCIAL HX: REVIEWED AND UPDATED TODAY    FAMILY HX: REVIEWED AND UPDATED TODAY    ALLERGY:  Penicillins    MEDS: REVIEWED  Prior to Visit Medications    Medication Sig Taking? Authorizing Provider   amLODIPine  (NORVASC ) 10 MG tablet TAKE ONE TABLET BY MOUTH DAILY Yes Giulitto, Carmella, MD   Ascorbic Acid (VITAMIN C) 250 MG tablet Take 1 tablet by mouth daily Yes [provider]   Flaxseed, Linseed, (FLAX SEED OIL) 1000 MG CAPS Take 2,000 mg by mouth daily. Yes [provider]   Omega-3 Fatty Acids (FISH OIL) 1000 MG CAPS Take 2 capsules by mouth 3 times daily Yes [provider]   Multiple Vitamins-Minerals (THERAPEUTIC MULTIVITAMIN-MINERALS) tablet Take 1 tablet by mouth daily Yes [provider]   Garlic 500 MG TABS Take 100 mg by mouth daily. Yes [provider]     OB/GYN: POST MENOPAUSAL                  LAST PAP SMEAR > 3 YEARS                  NO PRIOR MAMMOGRAM     IMMUNIZATION: DOES NOT TAKE VACCINATION    ROS: COMPREHENSIVE ROS AS IN HX, REST -VE  History obtained from chart review and the patient  NO PRIOR C  SCOPE       OBJECTIVE:   NURSING NOTE AND VITALS REVIEWED  BP (!) 146/82   Pulse 82   Temp 98 F (36.7 C) (Oral)   Resp 24   Ht 1.607 m (5' 3.25)   Wt 78 kg (172 lb)   SpO2 97%   BMI 30.23 kg/m     NO ACUTE DISTRESS    REPEAT BP:  138/90 (LT). NO ORTHOSTASIS     RESP 14    Body mass index is 30.23 kg/m.     WAIST CIRCUMFERENCE: 42.5    HEENT: MILD PALLOR, ANICTERIC, PERRLA, EOMI, NO CONJUNCTIVAL ERYTHEMA,                 NO SINUS TENDERNESS  NECK:  SUPPLE, TRACHEA MIDLINE, NT, NO JVD, NO CB, NO LA, NO TM, NO STIFFNESS  CHEST: RESPY EFFORT NL, GOOD AE, NO W/R/C  HEART: S1S2+ REG, NO M/G/R  ABD: SOFT, NT, NO HSM, BS+  EXT: NO EDEMA, NT, PULSES +. HOMAN'S -VE  NEURO: ALERT  AND ORIENTED X 3, NO MENINGEAL SIGNS, NO TREMORS, NL GAIT, NO FOCAL DEFICITS  PSYCH: FAIRLY GOOD AFFECT  BACK: NT, NO ROM, NO CVA TENDERNESS     PREVIOUS LABS REVIEWED AND D/W PT    UA: TRACE BLOOD, NO LEUKOCYTES    PREG TEST: NEGATIVE    ASSESSMENT / PLAN:     Diagnosis Orders   1. Encounter to establish care  COUNSELLED. HEALTH / SAFETY EDUCATION REVIEWED AND ADVISED  ADVISED ON OVERALL HEALTH AND WELLNESS  SEE BELOW       2. Primary hypertension  COUNSELLED. NOT AT GOAL. ADVISED MED COMPLIANCE - NORVASC  REFILLED  ADVISED LOW NA+ / DASH DIET/ EXERCISE. MONITOR. GOAL </= 130/80  F/U LABS  MAKE CHANGES AS NEEDED.        3. Kidney stone  COUNSELLED. ASYMPTOMATIC AT THIS TIME  ADVISED MAINTAIN HYDRATION  MONITOR. MAKE CHANGES AS NEEDED.       4. Other allergic rhinitis  COUNSELLED. SYMPTOMATIC RX. MED PRN  MAKE CHANGES AS NEEDED.       5. Microscopic hematuria  COUNSELLED. LAB TO EVAL  MONITOR. MAKE CHANGES AS NEEDED.       6. Smokes cigars  Smoking cessation was encouraged. Cessation techniques reviewed today.  COUNSELLED. CESSATION ADVISED   PR TOBACCO USE CESSATION INTERMEDIATE 3-10 MINUTES (5 MINS)  COMPLICATIONS OF TOBACCO USE INCLUDING COPD, CANCER, CAD D/W PT  ADVISED TO SET QUIT DATE  PT VERBALIZED UNDERSTANDING  MAKE CHANGES AS  NEEDED.       7. Marijuana use  COUNSELLED. CESSATION ADVISED  MONITOR. MAKE CHANGES AS NEEDED.       8. Screening for condition  COUNSELLED. LABS TO EVAL - OK WITH PT  MONITOR. MAKE CHANGES AS NEEDED.             PT DECLINED MAMMOGRAM AND COLON CA SCREEN AT THIS TIME - READDRESS            MEDICATION SIDE EFFECTS D/W PATIENT      RETURN TO CLINIC WITHIN 3 MONTHS / PRN    FOLLOW UP FOR FASTING LABS

## 2023-06-13 ENCOUNTER — Inpatient Hospital Stay
Admit: 2023-06-13 | Discharge: 2023-06-13 | Disposition: A | Discharge status: Home / Self Care | Payer: Medicaid (Managed Care)

## 2023-06-13 ENCOUNTER — Emergency Department: Admit: 2023-06-13 | Payer: Medicaid (Managed Care)

## 2023-06-13 DIAGNOSIS — R1031 Right lower quadrant pain: Secondary | ICD-10-CM

## 2023-06-13 DIAGNOSIS — R109 Unspecified abdominal pain: Secondary | ICD-10-CM

## 2023-06-13 LAB — DIFFERENTIAL
Basophils Absolute: 80 /uL (ref 0–200)
Basophils Relative: 1.1 % — ABNORMAL HIGH (ref 0.0–1.0)
Eosinophils Absolute: 102 /uL (ref 15–500)
Eosinophils Relative: 1.4 % (ref 0.0–8.0)
Lymphocytes Absolute: 2329 /uL (ref 850–3900)
Lymphocytes Relative: 31.9 % (ref 15.0–45.0)
Monocytes Absolute: 416 /uL (ref 200–950)
Monocytes Relative: 5.7 % (ref 0.0–12.0)
Neutrophils Absolute: 4373 /uL (ref 1500–7800)
Neutrophils Relative: 59.9 % (ref 40.0–80.0)
nRBC: 0 /100{WBCs} (ref 0–0)

## 2023-06-13 LAB — BASIC METABOLIC PANEL
Anion Gap: 8 mmol/L (ref 3–16)
BUN: 14 mg/dL (ref 7–25)
CO2: 23 mmol/L (ref 21–33)
Calcium: 8.9 mg/dL (ref 8.6–10.3)
Chloride: 107 mmol/L (ref 98–110)
Creatinine: 0.74 mg/dL (ref 0.60–1.30)
EGFR: >90
Glucose: 72 mg/dL (ref 70–100)
Osmolality, Calculated: 285 mosm/kg (ref 278–305)
Potassium: 3.5 mmol/L (ref 3.5–5.3)
Sodium: 138 mmol/L (ref 133–146)

## 2023-06-13 LAB — HEPATIC FUNCTION PANEL
ALT: 10 U/L (ref 7–52)
AST: 20 U/L (ref 13–39)
Albumin: 3.9 g/dL (ref 3.5–5.7)
Alkaline Phosphatase: 69 U/L (ref 36–125)
Bilirubin, Direct: 0.1 mg/dL (ref 0.0–0.4)
Bilirubin, Indirect: 0.6 mg/dL (ref 0.0–1.1)
Total Bilirubin: 0.7 mg/dL (ref 0.0–1.5)
Total Protein: 7.4 g/dL (ref 6.4–8.9)

## 2023-06-13 LAB — URINALYSIS-MACROSCOPIC W/REFLEX TO MICROSCOPIC
Bilirubin, UA: NEGATIVE
Blood, UA: NEGATIVE
Glucose, UA: NEGATIVE mg/dL
Ketones, UA: NEGATIVE mg/dL
Leukocyte Esterase, UA: NEGATIVE
Nitrite, UA: NEGATIVE
Protein, UA: NEGATIVE mg/dL
Specific Gravity, UA: 1.007 (ref 1.005–1.035)
Urobilinogen, UA: <2 mg/dL (ref 0.2–1.9)
pH, UA: 6.5 (ref 5.0–8.0)

## 2023-06-13 LAB — LIPASE: Lipase: 26 U/L (ref 4–82)

## 2023-06-13 LAB — CBC
Hematocrit: 38.6 % (ref 35.0–45.0)
Hemoglobin: 12.9 g/dL (ref 11.7–15.5)
MCH: 27.9 pg (ref 27.0–33.0)
MCHC: 33.4 g/dL (ref 32.0–36.0)
MCV: 83.6 fL (ref 80.0–100.0)
MPV: 7.8 fL (ref 7.5–11.5)
Platelets: 302 10*3/uL (ref 140–400)
RBC: 4.62 10*6/uL (ref 3.80–5.10)
RDW: 15.6 % — ABNORMAL HIGH (ref 11.0–15.0)
WBC: 7.3 10*3/uL (ref 3.8–10.8)

## 2023-06-13 MED ORDER — ondansetron (ZOFRAN) injection 4 mg
4 | Freq: Once | INTRAMUSCULAR | Status: AC
Start: 2023-06-13 — End: 2023-06-13
  Administered 2023-06-13: 15:00:00 via INTRAVENOUS

## 2023-06-13 MED ORDER — methocarbamoL (ROBAXIN) 500 MG tablet
500 | ORAL_TABLET | Freq: Three times a day (TID) | ORAL | 0 refills | Status: AC | PRN
Start: 2023-06-13 — End: ?
  Filled 2023-06-13: qty 30, 10d supply, fill #0

## 2023-06-13 MED ORDER — ketorolac (TORADOL) injection 15 mg
15 | Freq: Once | INTRAMUSCULAR | Status: AC
Start: 2023-06-13 — End: 2023-06-13
  Administered 2023-06-13: 15:00:00 via INTRAVENOUS

## 2023-06-13 MED ORDER — electrolyte-R (pH 7.4) (NORMOSOL-R pH 7.4) IV bolus 1,000 mL
Freq: Once | INTRAVENOUS | Status: AC
Start: 2023-06-13 — End: 2023-06-13
  Administered 2023-06-13: 15:00:00 via INTRAVENOUS

## 2023-06-13 MED ORDER — HYDROmorphone (DILAUDID) injection Syrg 1 mg
1 | Freq: Once | INTRAMUSCULAR | Status: AC
Start: 2023-06-13 — End: 2023-06-13

## 2023-06-13 MED ORDER — OMNIPAQUE (iohexol) 350 mg iodine/mL 150 mL
350 | Freq: Once | INTRAVENOUS | Status: AC | PRN
Start: 2023-06-13 — End: 2023-06-13
  Administered 2023-06-13: 19:00:00 via INTRAVENOUS

## 2023-06-13 MED ORDER — HYDROmorphone (DILAUDID) injection 0.5 mg
0.5 | Freq: Once | INTRAMUSCULAR
Start: 2023-06-13 — End: 2023-06-13

## 2023-06-13 MED ORDER — HYDROmorphone (DILAUDID) 1 mg/mL injection Syrg
1 | INTRAMUSCULAR | Status: AC
Start: 2023-06-13 — End: 2023-06-13
  Administered 2023-06-13: 17:00:00 via INTRAVENOUS

## 2023-06-13 MED FILL — OMNIPAQUE 350 MG IODINE/ML INTRAVENOUS SOLUTION: 350 350 mg iodine/mL | INTRAVENOUS | Qty: 150

## 2023-06-13 MED FILL — HYDROMORPHONE 1 MG/ML INJECTION SYRINGE: 1 1 mg/mL | INTRAMUSCULAR | Qty: 1

## 2023-06-13 MED FILL — KETOROLAC 15 MG/ML INJECTION SOLUTION: 15 15 mg/mL | INTRAMUSCULAR | Qty: 1

## 2023-06-13 MED FILL — ONDANSETRON HCL (PF) 4 MG/2 ML INJECTION SOLUTION: 4 4 mg/2 mL | INTRAMUSCULAR | Qty: 2

## 2023-06-13 NOTE — ED Provider Notes (Signed)
ED Attending Attestation Note    Date of service:  06/13/2023    This patient was seen by the resident physician.  I have seen and examined the patient, agree with the workup, evaluation, management and diagnosis. The care plan has been discussed and I concur.     My assessment reveals a 52 y.o. female complaining of right flank pain radiating to the groin.  No complaints of fevers.  Some nausea without persistent vomiting.  No apparent respiratory distress.
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
## 2023-06-13 NOTE — ED Notes (Signed)
 Pt verbalizes understanding of discharge instructions and has been given the opportunity to ask questions.  Due to infection precautions, patient is unable to sign at this time.

## 2023-06-13 NOTE — ED Notes (Signed)
Patient changing in to hospital scrubs due to she urinated on herself.
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
## 2023-06-13 NOTE — ED Notes (Signed)
1005: purewick placed
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
## 2023-06-13 NOTE — ED Notes (Signed)
Resident at bedside.  

## 2023-06-13 NOTE — Discharge Instructions (Addendum)
You were seen Emergency Department for right sided abdominal pain/back pain.  We evaluated you with lab work and a CT scan.  We did not find any evidence of a kidney stone or urinary tract infection.  We did CT on a CT that you have a left-sided ovarian cyst.  I think you should follow-up with your PCP regarding this ongoing pain.  I have provided you with a muscle relaxer that you can try to help with possible back spasms.  If this medication is not helpful then you should stop taking it.

## 2023-06-26 ENCOUNTER — Ambulatory Visit
Admit: 2023-06-26 | Discharge: 2023-07-03 | Payer: Medicaid (Managed Care) | Attending: Internal Medicine | Admitting: Internal Medicine | Primary: Internal Medicine

## 2023-06-26 VITALS — BP 130/74 | HR 90 | Temp 99.30000°F | Wt 177.8 lb

## 2023-06-26 DIAGNOSIS — F419 Anxiety disorder, unspecified: Secondary | ICD-10-CM

## 2023-06-26 MED ORDER — HYDROXYZINE HCL 25 MG PO TABS
25 | ORAL_TABLET | Freq: Three times a day (TID) | ORAL | 1 refills | Status: DC | PRN
Start: 2023-06-26 — End: 2024-03-12

## 2023-06-26 MED ORDER — AMLODIPINE BESYLATE 10 MG PO TABS
10 MG | ORAL_TABLET | ORAL | 0 refills | Status: DC
Start: 2023-06-26 — End: 2023-09-26

## 2023-06-26 MED ORDER — DICLOFENAC SODIUM 50 MG PO TBEC
50 MG | ORAL_TABLET | Freq: Two times a day (BID) | ORAL | 0 refills | Status: DC | PRN
Start: 2023-06-26 — End: 2023-10-08

## 2023-06-26 MED ORDER — TIZANIDINE HCL 4 MG PO TABS
4 MG | ORAL_TABLET | Freq: Three times a day (TID) | ORAL | 0 refills | Status: DC | PRN
Start: 2023-06-26 — End: 2023-10-08

## 2023-06-26 NOTE — Patient Instructions (Signed)
 TAKE MED AS ADVISED    DIET/ EXERCISE.    FOLLOW UP WITHIN 3 MONTHS / AS NEEDED    FOLLOW UP FOR FASTING LABS, GYNE    [facility] Locations - No appointment necessary.  ? indicates the location is open Saturdays in addition to Monday throug

## 2023-06-26 NOTE — Progress Notes (Signed)
 SUBJECTIVE:  Betty Cox is a 52 y.o. female HERE FOR   Chief Complaint   Patient presents with    Follow-up    Hypertension      PT HERE FOR EVAL     C/O  ANXIETY - STATES INCREASED PAST 2-3 MONTHS. ? NO DEPRESSION. OCC INSOMNIA. DENIES SUICIDAL / NO H

## 2023-09-19 ENCOUNTER — Encounter: Attending: Internal Medicine | Primary: Internal Medicine

## 2023-09-26 ENCOUNTER — Encounter

## 2023-09-26 NOTE — Telephone Encounter (Signed)
 Patient called for a med refill for Methocarbamol and Amlodipine. Patient would like these sent to [facility] pharmacy on [address].

## 2023-09-30 MED ORDER — AMLODIPINE BESYLATE 10 MG PO TABS
10 MG | ORAL_TABLET | ORAL | 0 refills | Status: DC
Start: 2023-09-30 — End: 2023-12-25

## 2023-09-30 NOTE — Telephone Encounter (Addendum)
 Pt call due to Pharmacy still not having prescription order to refill Amlodipine. Pt was agitated stated that her appt for 03/14 was cancel. I did inform Pt that we had tried to reach her to inform her about PCP not being in the office that day. Needing to reschedule, now PT is upset she has to wait to see PCP. But stated that Pharmacy told her they can't refill med due to PCP note stating she needs to see PT before refilling med.       [facility] [id number] [place], [place] - [address] - P [phone] - F [phone] [[id number]]

## 2023-09-30 NOTE — Telephone Encounter (Signed)
 NORVASC REFILLED. INFORM PT

## 2023-10-08 ENCOUNTER — Ambulatory Visit: Admit: 2023-10-08 | Discharge: 2023-10-15 | Payer: MEDICAID | Attending: Internal Medicine | Primary: Internal Medicine

## 2023-10-08 VITALS — BP 124/80 | HR 97 | Temp 98.60000°F | Ht 63.85 in | Wt 195.0 lb

## 2023-10-08 DIAGNOSIS — I10 Essential (primary) hypertension: Secondary | ICD-10-CM

## 2023-10-08 MED ORDER — TIZANIDINE HCL 4 MG PO TABS
4 MG | ORAL_TABLET | Freq: Three times a day (TID) | ORAL | 1 refills | Status: DC | PRN
Start: 2023-10-08 — End: 2023-11-13

## 2023-10-08 MED ORDER — METHYLPREDNISOLONE 4 MG PO TBPK
4 | PACK | ORAL | 0 refills | Status: AC
Start: 2023-10-08 — End: 2023-10-14

## 2023-10-08 MED ORDER — KETOROLAC TROMETHAMINE 60 MG/2ML IM SOLN
60 | Freq: Once | INTRAMUSCULAR | Status: AC
Start: 2023-10-08 — End: 2023-10-08
  Administered 2023-10-08: 22:00:00 60 mg via INTRAMUSCULAR

## 2023-10-08 MED ORDER — DICLOFENAC SODIUM 75 MG PO TBEC
75 MG | ORAL_TABLET | Freq: Two times a day (BID) | ORAL | 0 refills | Status: DC | PRN
Start: 2023-10-08 — End: 2023-11-13

## 2023-10-08 MED ORDER — LUMBAR BACK BRACE/SUPPORT PAD MISC
Freq: Every day | 0 refills | Status: AC
Start: 2023-10-08 — End: ?

## 2023-10-08 NOTE — Patient Instructions (Signed)
 TAKE MED AS ADVISED    DIET/ EXERCISE.    FOLLOW UP WITHIN 4 MONTHS / AS NEEDED    FOLLOW UP FOR FASTING LABS    [facility] Locations - No appointment necessary.  ? indicates the location is open Saturdays in addition to Monday through Friday.   Call your preferred location for test preparation, business hours and other information you need.   [employer] accepts [employer].  CENTRAL  EAST  WEST    ? [facility]   [address]   [address]   [place], [place] [address]    Ph: [phone]  [facility]   [address] [place], [place] [address]    Ph: [phone]   ? [facility]   [address],    [place], [place] [address]    Ph: [phone]     [facility]   [address]    [place], [place] [address]    Ph: [phone] ? [facility]   [address]    [place], [place] [address]   Ph: [phone]  ? [facility]   [address]   [place], [place] [address]    Ph: [phone]      [facility] [address]    [place], [place] [address]   Ph: [phone]     [facility]  [address]    [place], [place] [address]   Ph: [phone]    NORTH    ? [facility]   [address] [place]   [place] [address]    Ph: [phone]  [facility]   [address]   [place], [place] [address]   Ph: [phone]  [facility]   [address]   [place], [address]    PH: [phone] [address] Ctr[address]   [address]   [place], [place] [address]    Ph: [phone]  [address]  [place], [place] [address]  Ph: [phone]  [facility]   [address]    [place] [address]    Ph: [phone]

## 2023-10-08 NOTE — Progress Notes (Signed)
 SUBJECTIVE:  Betty Cox is a 53 y.o. female HERE FOR   Chief Complaint   Patient presents with    Follow-up    Hypertension        PT HERE FOR EVAL       HTN - TAKING MED. BP NOTED. + DIET / EXERCISE COMPLIANCE. NO HEADACHE , OCC DIZZINESS    LOW BACK PAIN - RT-  INCREASED RECENTLY. SHARP PAIN, OCC RAD TO RLE. PAIN SCALE 9/10 @ MAX. + STIFFNESS. DENIES TRAUMA. DJD ON CT    ANXIETY - TAKING MED PRN - HELPING. ? NO DEPRESSION. OCC INSOMNIA. DENIES SUICIDAL / NO HOMICIDAL THOUGHTS / IDEATION    OVARIAN CYST - NOTED ON CT - D/W PT. HAS NOT FOLLOWED WITH GYN    ALLERGIC RHINITIS - NO NASAL CONGESTION, NO POSTNASAL DRAINAGE , NO SINUS PRESSURE, NO HA, NO SNEEZING, NO WATERY ITCHY EYES.    SMOKES CIGARS - CESSATION ADVISED        DENIES CP, No SOB, No PALPITATIONS, No COUGH, NO F/C  No ABD PAIN, No N/V, No DIARRHEA, No CONSTIPATION, No MELENA, No HEMATOCHEZIA.  No DYSURIA, No FREQ, No URGENCY, No HEMATURIA    PMH: REVIEWED AND UPDATED TODAY    PSH: REVIEWED AND UPDATED TODAY    SOCIAL HX: REVIEWED AND UPDATED TODAY    FAMILY HX: REVIEWED AND UPDATED TODAY    ALLERGY:  Penicillins    MEDS: REVIEWED  Prior to Visit Medications    Medication Sig Taking? Authorizing Provider   amLODIPine (NORVASC) 10 MG tablet TAKE ONE TABLET BY MOUTH DAILY Yes Illene Regulus, MD   methocarbamol (ROBAXIN) 500 MG tablet Take 1 tablet by mouth 3 times daily as needed Yes [provider]   hydrOXYzine HCl (ATARAX) 25 MG tablet Take 1 tablet by mouth every 8 hours as needed for Anxiety Yes Illene Regulus, MD   diclofenac (VOLTAREN) 50 MG EC tablet Take 1 tablet by mouth 2 times daily as needed for Pain Yes Illene Regulus, MD   tiZANidine (ZANAFLEX) 4 MG tablet Take 1 tablet by mouth every 8 hours as needed (PRN) Yes Illene Regulus, MD   Ascorbic Acid (VITAMIN C) 250 MG tablet Take 1 tablet by mouth daily Yes [provider]   Flaxseed, Linseed, (FLAX SEED OIL) 1000 MG CAPS Take 2,000 mg by mouth daily. Yes [provider]   Omega-3 Fatty Acids (FISH OIL) 1000 MG CAPS Take 2 capsules by mouth 3 times daily Yes [provider]   Multiple Vitamins-Minerals (THERAPEUTIC MULTIVITAMIN-MINERALS) tablet Take 1 tablet by mouth daily Yes [provider]   Garlic 500 MG TABS Take 100 mg by mouth daily. Yes [provider]       ROS: COMPREHENSIVE ROS AS IN HX, REST -VE  History obtained from chart review and the patient       OBJECTIVE:   NURSING NOTE AND VITALS REVIEWED  BP 116/68   Pulse 97   Temp 98.6 F (37 C) (Oral)   Ht 1.622 m (5' 3.85")   Wt 88.5 kg (195 lb)   SpO2 100%   Breastfeeding No   BMI 33.63 kg/m     NO ACUTE RESPY DISTRESS. ++ PAIN DISTRESS    REPEAT BP:  124/80 (RT), NO ORTHOSTASIS     Body mass index is 33.63 kg/m.     HEENT: NO PALLOR, ANICTERIC, PERRLA, EOMI, NO CONJUNCTIVAL ERYTHEMA,  NO SINUS TENDERNESS  NECK:  SUPPLE, TRACHEA MIDLINE, NT, NO JVD, NO CB, NO LA, NO TM, NO STIFFNESS  CHEST: RESPY EFFORT NL, GOOD AE, NO W/R/C  HEART: S1S2+ REG, NO M/G/R  ABD: OBESE, SOFT, NT, NO HSM, BS+  EXT: NO EDEMA, NT, PULSES +. HOMAN'S -VE  NEURO: ALERT AND ORIENTED X 3, NO MENINGEAL SIGNS, NO TREMORS, NL GAIT, NO FOCAL DEFICITS  PSYCH: OCC TEARFUL - DUE TO PAIN OTHERWISE, FAIRLY GOOD AFFECT  BACK: + TENDERNESS RT LOWER, + PAIN WITH MVT, + ROM DUE TO PAIN, NO CVA TENDERNESS, NO RASH NOTED     PREVIOUS LABS REVIEWED AND D/W PT / LAST LABS PENDING    ASSESSMENT / PLAN:     Diagnosis Orders   1. Primary hypertension  COUNSELLED. CONTROLLED. CONTINUE MED. LOW NA+ / DASH DIET/ EXERCISE.   MONITOR. GOAL </= 130/80  F/UM LABS  MAKE CHANGES AS NEEDED.        2. Acute right-sided low back pain with right-sided sciatica  COUNSELLED. WORSENING PAIN  TORADOL 60 MG IM GIVEN FOR ACUTE PAIN - PT TOLERATED.  EXERCISES. ANALGESICS PRN.   INCREASE diclofenac (VOLTAREN) TO 75 MG EC BID / PRN WITH FOOD  START ON MEDROL DOSEPAK  ADVISED TIZANIDINE PRN - S/E D/W PT  PT DEFERRED X  RAY  DEFERRED PHYSICAL THERAPY REFERRAL  BACK BRACE ORDERED AS REQUESTED  ADVISED ICE ALTERNATE HEAT  MONITOR. MAKE CHANGES AS NEEDED.       3. Anxiety  COUNSELLED. IMPROVED. CONTINUE HYDROXYZINE PRN  PT COUNSELLED  ON RELAXATION TECHNIQUES.  ADDRESSED STRESS MGT.   PT NOT SUICIDAL/ NOT HOMICIDAL  MONITOR. MAKE CHANGES AS NEEDED.       4. Lesion of left ovary  COUNSELLED. ADVISED ON NEED FOR GYNE EVAL  PT VERBALIZED UNDERSTANDING  MONITOR. MAKE CHANGES AS NEEDED.       5. Other allergic rhinitis  COUNSELLED. SYMPTOMATIC RX. MED PRN  MONITOR. MAKE CHANGES AS NEEDED.       6. Smokes cigars  Smoking cessation was encouraged. Cessation techniques reviewed today.  COUNSELLED. CESSATION ADVISED   COMPLICATIONS OF TOBACCO USE INCLUDING COPD, CANCER, CAD D/W PT  ADVISED TO SET QUIT DATE  PT VERBALIZED UNDERSTANDING  MAKE CHANGES AS NEEDED.                         MEDICATION SIDE EFFECTS D/W PATIENT    RETURN TO CLINIC WITHIN 4 MONTHS / PRN    FOLLOW UP FOR FASTING LABS

## 2023-10-24 ENCOUNTER — Telehealth

## 2023-10-24 NOTE — Telephone Encounter (Signed)
 Patient is requesting an order for a xray to be put in the system. Please advise.

## 2023-10-27 NOTE — Telephone Encounter (Signed)
 PLEASE CLARIFY IF X RAY OF LOWER BACK

## 2023-10-27 NOTE — Telephone Encounter (Signed)
 Spoke with pt and it is lower back

## 2023-10-27 NOTE — Telephone Encounter (Signed)
 X RAY ORDERED. INFORM PT

## 2023-10-28 NOTE — Telephone Encounter (Signed)
 Pt is informed.

## 2023-10-31 ENCOUNTER — Inpatient Hospital Stay: Admit: 2023-10-31 | Payer: PRIVATE HEALTH INSURANCE | Primary: Internal Medicine

## 2023-10-31 DIAGNOSIS — M5441 Lumbago with sciatica, right side: Secondary | ICD-10-CM

## 2023-11-01 ENCOUNTER — Encounter

## 2023-11-01 LAB — COMPREHENSIVE METABOLIC PANEL
ALT: 20 U/L (ref 10–40)
AST: 22 U/L (ref 15–37)
Albumin/Globulin Ratio: 1.3 (ref 1.1–2.2)
Albumin: 3.9 g/dL (ref 3.4–5.0)
Alkaline Phosphatase: 102 U/L (ref 40–129)
Anion Gap: 8 (ref 3–16)
BUN: 11 mg/dL (ref 7–20)
CO2: 24 mmol/L (ref 21–32)
Calcium: 9.1 mg/dL (ref 8.3–10.6)
Chloride: 109 mmol/L (ref 99–110)
Creatinine: 0.7 mg/dL (ref 0.6–1.1)
Est, Glom Filt Rate: >90 (ref >60)
Glucose: 93 mg/dL (ref 70–99)
Potassium: 4.3 mmol/L (ref 3.5–5.1)
Sodium: 141 mmol/L (ref 136–145)
Total Bilirubin: <0.2 mg/dL (ref 0.0–1.0)
Total Protein: 6.8 g/dL (ref 6.4–8.2)

## 2023-11-01 LAB — LIPID PANEL
Cholesterol, Total: 161 mg/dL (ref 0–199)
HDL: 48 mg/dL (ref 40–60)
LDL Cholesterol: 99 mg/dL (ref <100)
Triglycerides: 68 mg/dL (ref 0–150)
VLDL Cholesterol Calculated: 14 mg/dL

## 2023-11-01 LAB — ALBUMIN/CREATININE RATIO, URINE
Albumin Urine: <1.2 mg/dL (ref <2.0)
Creatinine, Ur: 122 mg/dL (ref 28.0–259.0)

## 2023-11-01 LAB — URINALYSIS WITH REFLEX TO CULTURE
Bilirubin, Urine: NEGATIVE
Blood, Urine: NEGATIVE
Glucose, Ur: NEGATIVE mg/dL
Ketones, Urine: NEGATIVE mg/dL
Leukocyte Esterase, Urine: NEGATIVE
Nitrite, Urine: NEGATIVE
Protein, UA: NEGATIVE mg/dL
Specific Gravity, UA: 1.01 (ref 1.005–1.030)
Urobilinogen, Urine: 0.2 U/dL (ref <2.0)
pH, Urine: 7.5 (ref 5.0–8.0)

## 2023-11-01 LAB — VITAMIN D 25 HYDROXY: Vit D, 25-Hydroxy: 22.7 ng/mL — ABNORMAL LOW (ref >=30)

## 2023-11-01 LAB — CBC WITH AUTO DIFFERENTIAL
Basophils %: 1 %
Basophils Absolute: 0.1 10*3/uL (ref 0.0–0.2)
Eosinophils %: 1.3 %
Eosinophils Absolute: 0.1 10*3/uL (ref 0.0–0.6)
Hematocrit: 39.3 % (ref 36.0–48.0)
Hemoglobin: 12.9 g/dL (ref 12.0–16.0)
Lymphocytes %: 44.9 %
Lymphocytes Absolute: 3.3 10*3/uL (ref 1.0–5.1)
MCH: 27.5 pg (ref 26.0–34.0)
MCHC: 32.8 g/dL (ref 31.0–36.0)
MCV: 83.6 fL (ref 80.0–100.0)
MPV: 8.7 fL (ref 5.0–10.5)
Monocytes %: 6.4 %
Monocytes Absolute: 0.5 10*3/uL (ref 0.0–1.3)
Neutrophils %: 46.4 %
Neutrophils Absolute: 3.4 10*3/uL (ref 1.7–7.7)
Platelets: 302 10*3/uL (ref 135–450)
RBC: 4.71 M/uL (ref 4.00–5.20)
RDW: 15.1 % (ref 12.4–15.4)
WBC: 7.2 10*3/uL (ref 4.0–11.0)

## 2023-11-01 LAB — TSH REFLEX TO FT4: TSH Reflex FT4: 1.45 u[IU]/mL (ref 0.27–4.20)

## 2023-11-02 LAB — HEMOGLOBIN A1C
Estimated Avg Glucose: 125.5 mg/dL
Hemoglobin A1C: 6 %

## 2023-11-12 NOTE — Telephone Encounter (Signed)
SEE OTHER MESSAGE IN CHART  MAKE APPT

## 2023-11-12 NOTE — Telephone Encounter (Signed)
 CALLED AND D/W PT      IMPRESSION:  1. 4 mm grade 1 anterolisthesis of L4 on L5 in the neutral position. This is  unchanged with extension and increases to 6 mm with flexion.  2. Grade 1 anterolisthesis of L5 on S1 demonstrate no change with flexion or  extension.  3. Multilevel degenerative spondylosis greatest at L5-S1. Neuroforaminal  narrowing and central canal stenosis at this level suspected. If there is  concern for disc base pathology, additional imaging may be warranted.      DEFERRED MED CHANGE - READDRESS  ADVISED TO MAKE APPT  PT VERBALIZED UNDERSTANDING

## 2023-11-13 ENCOUNTER — Ambulatory Visit
Admit: 2023-11-13 | Discharge: 2023-11-19 | Payer: Medicaid (Managed Care) | Attending: Internal Medicine | Primary: Internal Medicine

## 2023-11-13 DIAGNOSIS — M545 Low back pain, unspecified: Secondary | ICD-10-CM

## 2023-11-13 MED ORDER — VITAMIN D3 25 MCG (1000 UT) PO CAPS
25 | ORAL_CAPSULE | Freq: Every day | ORAL | 0 refills | 60.00000 days | Status: DC
Start: 2023-11-13 — End: 2024-02-16

## 2023-11-13 MED ORDER — TIZANIDINE HCL 4 MG PO TABS
4 | ORAL_TABLET | Freq: Three times a day (TID) | ORAL | 1 refills | 15.00000 days | Status: DC | PRN
Start: 2023-11-13 — End: 2023-12-29

## 2023-11-13 MED ORDER — LIDOCAINE 5 % EX PTCH
5 | MEDICATED_PATCH | Freq: Every day | CUTANEOUS | 0 refills | 30.00000 days | Status: DC | PRN
Start: 2023-11-13 — End: 2024-06-14

## 2023-11-13 MED ORDER — DICLOFENAC SODIUM 75 MG PO TBEC
75 | ORAL_TABLET | Freq: Two times a day (BID) | ORAL | 0 refills | 30.00000 days | Status: DC | PRN
Start: 2023-11-13 — End: 2023-12-29

## 2023-11-13 MED ORDER — METHYLPREDNISOLONE 4 MG PO TBPK
4 | PACK | ORAL | 0 refills | 6.00 days | Status: AC
Start: 2023-11-13 — End: 2023-11-19

## 2023-11-13 NOTE — Progress Notes (Signed)
 SUBJECTIVE:  Betty Cox is a 53 y.o. female HERE FOR   Chief Complaint   Patient presents with    Follow-up     Needs P.T.    Back Pain    Referral - General      PT HERE FOR EVAL        LOW BACK PAIN - STATES NOW BIL. MOSTLY RT-   SHARP PAIN, OCC RAD TO  LOWER ABD - BIL. PAIN SCALE 10/10 @ MAX, NOW 5/10. + STIFFNESS.  ? NUMBNESS, ? OCC TINGLING. DENIES TRAUMA. STATES MEDS NOT HELPING MUCH. DID NOT TAKE MEDROL . X RAY D/W PT. STATES PROLONGED STANDING AT WORK - PAIN INCREASED FOLLOWING WORK    HTN - TAKING MED. BP NOTED. + DIET / EXERCISE COMPLIANCE. OCC HEADACHE , NO DIZZINESS    PREDIABETES - NEW ONSET. LAB D/W PT    ANXIETY - TAKING MED PRN - HELPING. ? NO DEPRESSION. OCC INSOMNIA. DENIES SUICIDAL / NO HOMICIDAL THOUGHTS / IDEATION    VIT D DEF - LAB D/W PT        DENIES CP, No SOB, No PALPITATIONS, No COUGH, NO F/C  No ABD PAIN, No N/V, No DIARRHEA, No CONSTIPATION, No MELENA, No HEMATOCHEZIA.  No DYSURIA, No FREQ, No URGENCY, No HEMATURIA      PMH: REVIEWED AND UPDATED TODAY    PSH: REVIEWED AND UPDATED TODAY    SOCIAL HX: REVIEWED AND UPDATED TODAY    FAMILY HX: REVIEWED AND UPDATED TODAY    ALLERGY:  Penicillins    MEDS: REVIEWED  Prior to Visit Medications    Medication Sig Taking? Authorizing Provider   diclofenac  (VOLTAREN ) 75 MG EC tablet Take 1 tablet by mouth 2 times daily as needed for Pain Yes Christine Cozier, MD   tiZANidine  (ZANAFLEX ) 4 MG tablet Take 1 tablet by mouth every 8 hours as needed (PRN) Yes Christine Cozier, MD   Elastic Bandages & Supports (LUMBAR BACK BRACE/SUPPORT PAD) MISC 1 each by Does not apply route daily Yes Christine Cozier, MD   amLODIPine  (NORVASC ) 10 MG tablet TAKE ONE TABLET BY MOUTH DAILY Yes Christine Cozier, MD   hydrOXYzine  HCl (ATARAX ) 25 MG tablet Take 1 tablet by mouth every 8 hours as needed for Anxiety Yes Christine Cozier, MD   Ascorbic Acid (VITAMIN C) 250 MG tablet Take 1 tablet by mouth daily Yes [provider]   Flaxseed, Linseed, (FLAX SEED OIL)  1000 MG CAPS Take 2,000 mg by mouth daily. Yes [provider]   Omega-3 Fatty Acids (FISH OIL) 1000 MG CAPS Take 2 capsules by mouth 3 times daily Yes [provider]   Multiple Vitamins-Minerals (THERAPEUTIC MULTIVITAMIN-MINERALS) tablet Take 1 tablet by mouth daily Yes [provider]   Garlic 500 MG TABS Take 100 mg by mouth daily. Yes [provider]       ROS: COMPREHENSIVE ROS AS IN HX, REST -VE  History obtained from chart review and the patient       OBJECTIVE:   NURSING NOTE AND VITALS REVIEWED  BP 120/70   Pulse 95   Temp 98.1 F (36.7 C) (Oral)   Wt 90.4 kg (199 lb 6.4 oz)   SpO2 96%   Breastfeeding No   BMI 34.39 kg/m     NO ACUTE RESPY DISTRESS. + PAIN DISTRESS    REPEAT BP: 124/80 (LT), NO ORTHOSTASIS     Body mass index is 34.39 kg/m.     HEENT: NO PALLOR, ANICTERIC, PERRLA, EOMI, NO  CONJUNCTIVAL ERYTHEMA,                 NO SINUS TENDERNESS  NECK:  SUPPLE, TRACHEA MIDLINE, NT, NO JVD, NO CB, NO LA, NO TM, NO STIFFNESS  CHEST: RESPY EFFORT NL, GOOD AE, NO W/R/C  HEART: S1S2+ REG, NO M/G/R  ABD: OBESE, SOFT, NT, NO GUARDING, NO RIGIDITY, NO HSM, BS+  EXT: NO EDEMA, NT, PULSES +. HOMAN'S -VE  NEURO: ALERT AND ORIENTED X 3, NO MENINGEAL SIGNS, NO TREMORS, NL GAIT, NO FOCAL DEFICITS  PSYCH: OCC SAD AFFECT. OCC TEARFUL DUE TO PAIN  BACK: + TENDERNESS LOWER BACK - BIL, + PAIN WITH MVT, OCC ROM, NO CVA TENDERNESS, NO RASH NOTED     PREVIOUS LABS / X RAY REVIEWED AND D/W PT  IMPRESSION:  1. 4 mm grade 1 anterolisthesis of L4 on L5 in the neutral position. This is  unchanged with extension and increases to 6 mm with flexion.  2. Grade 1 anterolisthesis of L5 on S1 demonstrate no change with flexion or  extension.  3. Multilevel degenerative spondylosis greatest at L5-S1. Neuroforaminal  narrowing and central canal stenosis at this level suspected. If there is  concern for disc base pathology, additional imaging may be warranted.      ASSESSMENT / PLAN:   Diagnosis  Orders   1. Acute bilateral low back pain, unspecified whether sciatica present  COUNSELLED. DJD. ABNORMAL X RAY.   DEFERRED TORADOL   ADVISED START ON MEDROL  DOSE PAK  START ON LIDODERM PATCH DAILY / PRN  CONTINUE DICLOFENAC  AND TIZANIDINE  PRN  EXERCISES. ANALGESICS PRN.   ADVISED ON HOME EXERCISES  AVOID HEAVY LIFTING, AVOID PROLONGED STANDING  REFER ORTHO / SPINE  DEFERRED PHYSICAL THERAPY REFERRAL AT THIS TIME  NOTE GIVEN FOR WORK  MONITOR. MAKE CHANGES AS NEEDED.        2. Primary hypertension  COUNSELLED. CONTROLLED. CONTINUE MED.   ADVISED LOW NA+ / DASH DIET/ EXERCISE. MONITOR. GOAL </= 130/80  MAKE CHANGES AS NEEDED.       3. Prediabetes  COUNSELLED. LABS D/W PT.  ADVISED ON DIET / EXERCISE  ADVISED RISK FACTOR MODIFICATION.  MONITOR. MAKE CHANGES AS NEEDED.       4. Anxiety  COUNSELLED. MED PRN  PT COUNSELLED  ON RELAXATION TECHNIQUES.  ADDRESSED STRESS MGT.   PT NOT SUICIDAL/ NOT HOMICIDAL  MONITOR. MAKE CHANGES AS NEEDED.       5. Vitamin D deficiency  COUNSELLED. NEW ONSET. ADVISED  START ON VIT D 1000 U DAILY.  MONITOR AND MAKE CHANGES AS NEEDED.                         MEDICATION SIDE EFFECTS D/W PATIENT      RETURN TO CLINIC PREVIOUS / PRN    FOLLOW UP WITH ORTHO / SPINE    NOTE GIVEN FOR WORK

## 2023-11-13 NOTE — Patient Instructions (Signed)
 TAKE MED AS ADVISED    DIET/ EXERCISE.    FOLLOW UP PREVIOUS / AS NEEDED    FOLLOW UP WITH ORTHO / SPINE    [facility] Locations - No appointment necessary.  ? indicates the location is open Saturdays in addition to Monday through Friday.   Call your preferred location for test preparation, business hours and other information you need.   [employer] accepts [employer].  CENTRAL  EAST  WEST    ? [facility]   [address]   [address]   [place], [place] [address]    Ph: [phone]  [facility]   [address] [place], [place] [address]    Ph: [phone]   ? [facility]   [address],    [place], [place] [address]    Ph: [phone]     [facility]   [address]    [place], [place] [address]    Ph: [phone] ? [facility]   [address]    [place], [place] [address]   Ph: [phone]  ? [facility]   [address]   [place], [place] [address]    Ph: [phone]      [facility] [address]    [place], [place] [address]   Ph: [phone]     [facility]  [address]    [place], [place] [address]   Ph: [phone]    NORTH    ? [facility]   [address]   [place] [address]    Ph: [phone]  [facility]   [address]   [place], [place] [address]   Ph: [phone]  [facility]   [address]   [place], [address]    PH: [phone] [address]   [address]   [place], [place] [address]    Ph: [phone]  [address]  [place], [place] [address]  Ph: [phone]  [facility]   [address]    [place] [address]    Ph: [phone]

## 2023-11-26 ENCOUNTER — Encounter: Payer: Medicaid (Managed Care) | Attending: Physician Assistant | Primary: Internal Medicine

## 2023-12-09 ENCOUNTER — Encounter: Payer: PRIVATE HEALTH INSURANCE | Primary: Internal Medicine

## 2023-12-10 ENCOUNTER — Encounter: Payer: Medicaid (Managed Care) | Attending: Physician Assistant | Primary: Internal Medicine

## 2023-12-23 ENCOUNTER — Emergency Department

## 2023-12-23 ENCOUNTER — Inpatient Hospital Stay: Admit: 2023-12-23 | Discharge: 2023-12-24 | Disposition: A | Discharge status: Home / Self Care

## 2023-12-23 ENCOUNTER — Emergency Department: Admit: 2023-12-24

## 2023-12-23 DIAGNOSIS — M545 Low back pain, unspecified: Secondary | ICD-10-CM

## 2023-12-23 MED ORDER — lidocaine (LIDODERM) 5 % 1 patch
5 | Freq: Once | TOPICAL | Status: AC
Start: 2023-12-23 — End: 2023-12-24
  Administered 2023-12-23: via TRANSDERMAL

## 2023-12-23 MED ORDER — droPERidol (INAPSINE) injection 2.5 mg
2.5 | Freq: Once | INTRAMUSCULAR | Status: AC
Start: 2023-12-23 — End: 2023-12-23
  Administered 2023-12-24: 04:00:00 via INTRAVENOUS

## 2023-12-23 MED ORDER — acetaminophen (TYLENOL) tablet 975 mg
325 | Freq: Once | ORAL | Status: AC
Start: 2023-12-23 — End: 2023-12-23
  Administered 2023-12-24: 01:00:00 via ORAL

## 2023-12-23 MED ORDER — oxyCODONE (ROXICODONE) immediate release tablet 5 mg
5 | Freq: Once | ORAL | Status: AC
Start: 2023-12-23 — End: 2023-12-23
  Administered 2023-12-24: 01:00:00 via ORAL

## 2023-12-23 MED ORDER — methocarbamoL (ROBAXIN) tablet 750 mg
500 | Freq: Once | ORAL | Status: AC
Start: 2023-12-23 — End: 2023-12-23
  Administered 2023-12-23: via ORAL

## 2023-12-23 MED ORDER — ketorolac (TORADOL) injection 15 mg
15 | Freq: Once | INTRAMUSCULAR | Status: AC
Start: 2023-12-23 — End: 2023-12-23
  Administered 2023-12-23: via INTRAMUSCULAR

## 2023-12-23 MED FILL — METHOCARBAMOL 500 MG TABLET: 500 500 MG | ORAL | Qty: 2 | Fill #0

## 2023-12-23 MED FILL — KETOROLAC 15 MG/ML INJECTION SOLUTION: 15 15 mg/mL | INTRAMUSCULAR | Qty: 2 | Fill #0

## 2023-12-23 MED FILL — LIDOCAINE 5 % TOPICAL PATCH: 5 5 % | TOPICAL | Qty: 1 | Fill #0

## 2023-12-23 NOTE — ED Notes (Signed)
 Notified CT that patient received droperidol at 2330.
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
## 2023-12-23 NOTE — ED Notes (Signed)
 Per CT transport, pt became restless and scared during the scan and therefore they were unable to complete the scan at this time.
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
## 2023-12-24 ENCOUNTER — Emergency Department

## 2023-12-24 LAB — HCG QUALITATIVE W/REFLEX TO HCG QUANT: HCG Qualitative: NEGATIVE

## 2023-12-24 MED FILL — DROPERIDOL 2.5 MG/ML INJECTION SOLUTION: 2.5 2.5 mg/mL | INTRAMUSCULAR | Qty: 2 | Fill #0

## 2023-12-24 MED FILL — TYLENOL 325 MG TABLET: 325 325 mg | ORAL | Qty: 3 | Fill #0

## 2023-12-24 MED FILL — OXYCODONE 5 MG TABLET: 5 5 MG | ORAL | Qty: 1 | Fill #0

## 2023-12-24 NOTE — Discharge Instructions (Signed)
 Take naproxen twice per day every day with food for 3 days, then as needed for continued pain and swelling.  Please understand, you will hurt worse tomorrow than you do today.  This is normal and expected.  You should begin to gradually improve each day after that.  Please call your doctor, or return to the emergency department, if you have increasing pain, or if you develop pain in other areas of your body that were not previously painful, or if you develop persistent headaches, visual changes, nausea or vomiting, or other concerning symptoms.      You decided to leave without having the CT scans done.  You should follow-up with your primary care doctor.

## 2023-12-24 NOTE — ED Notes (Signed)
 Per CT transport, pt still unable to tolerate scans post meds. MD notified.

## 2023-12-25 ENCOUNTER — Encounter

## 2023-12-25 MED ORDER — AMLODIPINE BESYLATE 10 MG PO TABS
10 | ORAL_TABLET | ORAL | 1 refills | Status: DC
Start: 2023-12-25 — End: 2024-03-24

## 2023-12-25 NOTE — Telephone Encounter (Signed)
 Medication:   Requested Prescriptions     Pending Prescriptions Disp Refills    amLODIPine  (NORVASC ) 10 MG tablet 90 tablet 0     Sig: TAKE ONE TABLET BY MOUTH DAILY        Last Filled:      Patient Phone Number: [phone] (home)     Last appt: 11/13/2023   Next appt: Visit date not found    Last OARRS:        No data to display

## 2023-12-29 ENCOUNTER — Ambulatory Visit
Admit: 2023-12-29 | Discharge: 2023-12-29 | Payer: PRIVATE HEALTH INSURANCE | Attending: Internal Medicine | Primary: Internal Medicine

## 2023-12-29 ENCOUNTER — Ambulatory Visit: Admit: 2023-12-29 | Payer: PRIVATE HEALTH INSURANCE | Primary: Internal Medicine

## 2023-12-29 DIAGNOSIS — M545 Low back pain, unspecified: Secondary | ICD-10-CM

## 2023-12-29 MED ORDER — CELECOXIB 200 MG PO CAPS
200 | ORAL_CAPSULE | Freq: Every day | ORAL | 1 refills | 60.00000 days | Status: DC | PRN
Start: 2023-12-29 — End: 2024-03-15

## 2023-12-29 MED ORDER — TIZANIDINE HCL 4 MG PO TABS
4 | ORAL_TABLET | Freq: Three times a day (TID) | ORAL | 1 refills | 30.00000 days | Status: DC | PRN
Start: 2023-12-29 — End: 2024-03-22

## 2023-12-29 MED ORDER — METHYLPREDNISOLONE 4 MG PO TBPK
4 | PACK | ORAL | 0 refills | 6.00000 days | Status: DC
Start: 2023-12-29 — End: 2024-02-16

## 2023-12-29 MED ORDER — TRAMADOL HCL 50 MG PO TABS
50 | ORAL_TABLET | Freq: Four times a day (QID) | ORAL | 0 refills | 7.00000 days | Status: AC | PRN
Start: 2023-12-29 — End: 2024-01-03
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
## 2024-01-08 ENCOUNTER — Encounter: Payer: PRIVATE HEALTH INSURANCE | Attending: Internal Medicine | Primary: Internal Medicine

## 2024-01-22 ENCOUNTER — Encounter: Payer: PRIVATE HEALTH INSURANCE | Attending: Internal Medicine | Primary: Internal Medicine

## 2024-01-22 NOTE — Telephone Encounter (Signed)
 Pre-cert will be taking care of this.

## 2024-01-22 NOTE — Telephone Encounter (Signed)
 General Question     Subject: SUBMIT MRI APPROVAL FOR URGENT REQUEST   Patient and /or Facility Request: Betty Cox  Contact Number: [phone]    PRIOR AUTHORIZATION FAX NUMBER   FAX# [phone]    PUT AT ATTENTION TO DR DALLAS

## 2024-01-22 NOTE — Addendum Note (Signed)
 Addended by: NALANI RAISIN on: 01/22/2024 12:03 PM     Modules accepted: Orders

## 2024-01-22 NOTE — Telephone Encounter (Signed)
 General Question     Subject: MRI APPROVAL   Patient and /or Facility Request: Genetta, Fiero   Contact Number: [phone]

## 2024-01-22 NOTE — Telephone Encounter (Signed)
 Will resubmit for approval. We will let her know when it is approved.

## 2024-01-27 NOTE — Telephone Encounter (Signed)
 PATIENT IS REQUESTING FACILITY CHANGE FOR MRI TO  BE DONE AT THE [facility]    PLEASE ADVISE WHEN COMPLETED.

## 2024-02-03 ENCOUNTER — Encounter: Payer: PRIVATE HEALTH INSURANCE | Attending: Internal Medicine | Primary: Internal Medicine

## 2024-02-06 NOTE — Telephone Encounter (Signed)
 General Question     Subject: Patient is going thru anxiety due to her upcoming MRI she has today @ 1:45 , she is asking if she can take celebrex  and hydro hcl 25mg  prior to , to help with her anxiety and get her thru this  Patient and /or Facility Request: Betty Cox Number: [phone]

## 2024-02-06 NOTE — Telephone Encounter (Signed)
 Addressed in a separate encounter

## 2024-02-09 NOTE — Telephone Encounter (Signed)
 Pre-Cert pool:    Please see pt's message.  She was unable to get the MRI done.  Pt would like to change locations to [facility] for MRI, so she may have sedation.  Cameron will want me to place a new order with the sedation info on it, and it will ask if I want to discontinue the previous order.  Does this mean that it will need to go through the PA process again?     Thanks and please let me know the best way to go about it.

## 2024-02-10 NOTE — Addendum Note (Signed)
 Addended by: NALANI RAISIN on: 02/10/2024 10:23 AM     Modules accepted: Orders

## 2024-02-13 NOTE — Addendum Note (Signed)
 Addended by: NALANI RAISIN on: 02/13/2024 09:31 AM     Modules accepted: Orders

## 2024-02-16 ENCOUNTER — Ambulatory Visit: Admit: 2024-02-16 | Payer: PRIVATE HEALTH INSURANCE | Attending: Internal Medicine | Primary: Internal Medicine

## 2024-02-16 VITALS — BP 130/80 | HR 96 | Temp 98.60000°F | Resp 14 | Ht 64.0 in | Wt 199.0 lb

## 2024-02-16 DIAGNOSIS — Z01818 Encounter for other preprocedural examination: Principal | ICD-10-CM

## 2024-02-16 MED ORDER — VITAMIN D3 25 MCG (1000 UT) PO CAPS
25 | ORAL_CAPSULE | Freq: Every day | ORAL | 0 refills | 60.00000 days | Status: AC
Start: 2024-02-16 — End: ?

## 2024-02-16 NOTE — Patient Instructions (Addendum)
 TAKE MED AS ADVISED    DIET/ EXERCISE.    FOLLOW UP WITHIN 3 MONTHS / AS NEEDED    FOLLOW UP FOR LABS    [facility] Locations  No appointment necessary  Open Monday - Friday     CENTRAL  EAST  WEST     [place]   [address]   [address]   [place], [place] [address]    Ph: [phone]  [facility]   [address] [place], [place] [address]    Ph: [phone]   [place]   [address],    [place], [place] [address]    Ph: [phone]     [facility]   [address]    [place], [place] [address]    Ph: [phone]  [place]   [address]    [place], [place] [address]   Ph: [phone]  [facility]   [address]   [place], [place] [address]    Ph: [phone]      [facility]   [address]    [place], [place] [address]   Ph: [phone]    [facility]   [address]   [place], [place] [address]    Ph: [phone]   [facility]   [address]   [place], [place] [address]   Ph: [phone]  [facility]   [address]   [place], [address]    [facility]: [phone] [address]   [address]   [place], [place] [address]   Ph: [phone]  [facility]   [address]    [place] [address]    Ph: [phone]        Call your preferred location for business hours, test preparation and additional information. [employer] accepts [employer].
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
## 2024-02-17 ENCOUNTER — Encounter: Payer: PRIVATE HEALTH INSURANCE | Attending: Internal Medicine | Primary: Internal Medicine

## 2024-02-25 ENCOUNTER — Ambulatory Visit
Admit: 2024-02-25 | Discharge: 2024-02-25 | Payer: PRIVATE HEALTH INSURANCE | Attending: Internal Medicine | Primary: Internal Medicine

## 2024-02-25 DIAGNOSIS — M5136 Other intervertebral disc degeneration, lumbar region with discogenic back pain only: Principal | ICD-10-CM

## 2024-02-25 MED ORDER — METHYLPREDNISOLONE 4 MG PO TBPK
4 | PACK | ORAL | 0 refills | 6.00000 days | Status: DC
Start: 2024-02-25 — End: 2024-03-12

## 2024-02-25 NOTE — Progress Notes (Signed)
 Chief Complaint:   Chief Complaint   Patient presents with    Lower Back Pain     Lumbar - Medrol  with 80% relief for the first day and then decreased over the next couple days. MRI is scheduled for Sept 2nd with sedation. Pain is so terrible she is crying.          History of Present Illness:       Patient is a 53 y.o. female who returns in follow up for the above complaint. The patient was last seen approximately 2 monthsago. The symptoms   show no change since the last visit. The patient has had no further testing for the problem.      MRI with IV sedation is pending    Back:Bilateral leg pain 85: 15 . Pain in back is aching in quality.    Pain levels:9.       The patient denies new onset or progressive weakness of the lower extremities.  The patient denies new onset bowel or bladder dysfunction.    She continues on medical pain management as per previous inclusive of NSAID- , muscle relaxant-     Present Medications:         Current Outpatient Medications   Medication Sig Dispense Refill    methylPREDNISolone  (MEDROL , PAK,) 4 MG tablet By mouth. 1 kit 0    traMADol  (ULTRAM ) 50 MG tablet Take 1 tablet by mouth every 6 hours as needed for Pain. Max Daily Amount: 200 mg      vitamin D (VITAMIN D3) 25 MCG (1000 UT) CAPS Take 1 capsule by mouth daily 90 capsule 0    celecoxib  (CELEBREX ) 200 MG capsule Take 1 capsule by mouth daily as needed for Pain Start after completing medrol . 30 capsule 1    tiZANidine  (ZANAFLEX ) 4 MG tablet Take 1 tablet by mouth every 8 hours as needed (PRN) 30 tablet 1    amLODIPine  (NORVASC ) 10 MG tablet TAKE ONE TABLET BY MOUTH DAILY 90 tablet 1    lidocaine  (LIDODERM ) 5 % Place 1 patch onto the skin daily as needed for Pain 12 hours on, 12 hours off. 30 patch 0    Elastic Bandages & Supports (LUMBAR BACK BRACE/SUPPORT PAD) MISC 1 each by Does not apply route daily 1 each 0    hydrOXYzine  HCl (ATARAX ) 25 MG tablet Take 1 tablet by mouth every 8 hours as needed for Anxiety 30 tablet 1     Ascorbic Acid (VITAMIN C) 250 MG tablet Take 1 tablet by mouth daily      Flaxseed, Linseed, (FLAX SEED OIL) 1000 MG CAPS Take 2,000 mg by mouth daily      Omega-3 Fatty Acids (FISH OIL) 1000 MG CAPS Take 2 capsules by mouth 3 times daily      Multiple Vitamins-Minerals (THERAPEUTIC MULTIVITAMIN-MINERALS) tablet Take 1 tablet by mouth daily      Garlic 500 MG TABS Take 100 mg by mouth daily       No current facility-administered medications for this visit.         Allergies:        Reviewed in EMR     Review of Symptoms:    Pertinent items are noted in HPI         Vital Signs:    There were no vitals filed for this visit.     General Examination:    Constitutional: Patient is adequately groomed with no evidence of malnutrition  Physical Exam: lower back       Primary Exam:    Inspection:  No deformity, atrophy or appreciable curvature      Palpation:  No focal trigger point tenderness      Range of Motion:  0/0; pain with flexion and extension      Strength:  Normal lower extremity       Special Tests:  PositiveSLRright for back pain      Skin: There are no rashes, ulcerations or lesions.      Gait: Non antalgic       Neurovascular - non focal and intact      Additional Comments:        Additional Examinations:                       Office Imaging Results/Procedures PerformedToday:           Office Procedures:   No orders of the defined types were placed in this encounter.          Other Outside Imaging and Testing Personally Reviewed:       none      Assessment   Impression: .    Encounter Diagnoses   Name Primary?    Lumbar discogenic pain syndrome Yes    Degenerative spondylolisthesis     Lumbar spondylosis     Multilevel degenerative disc disease               Plan:       Proceed with MRI lumbar spine evaluate severity of discopathy/ stenosis suspected clinically  Consider Her a candidate for spine intervention injection  Activity modification, lumbar spine precautions  lumbar spinestabilization home  exercise program  Medical pain management: NSAID: Celebrex , muscle relaxant: Flex and maximize local measures for symptom control  LSO as needed  Consider period of time off work for escalating pain levels     20 minutes was spent related to previewing pertinent medical documentation prior to the patient's visit along with counseling during the patient's visit with respect to treatment options inclusive of alternatives to treatment and the complications and risks related to those treatment options along with expectations of outcome related to those treatments and inclusive of time in the documentation and ordering of testing and treatment after the visit.      Orders:      No orders of the defined types were placed in this encounter.        Disclaimer:    This note was dictated with voice recognition software. Though review and correction are routine, we apologize for any errors.

## 2024-02-27 NOTE — Telephone Encounter (Signed)
 Patient scheduled for surgery on the 9th of September but has gotten labs done stating she can't afford them. Surgery can't be done without labs being completed. Surgeon wanting to know if labs need to be done for primary to clear for surgery and if not addendum added to h and p please advise.  Patient scheduled for mri with anesthesia.

## 2024-02-27 NOTE — Progress Notes (Addendum)
 02/27/2024 0929 AM:    Patient verified Legal name and date of birth. Telephone Interview (TI) Completed- including review of medications, allergies, medical history, need for ride home from family/friend/caregiver, need for a  caregiver for at least 24 hours after procedure, and Arrive at least two hours prior to procedure(see preop instructions)/ts       [facility] Pre[facility]/Procedure Instructions sent to my chart/TS      EKG IN EPIC 02/16/2024/TS    H&P IN EPIC NOTES 02/16/2024 AND CLEARANCE IN EPIC LETTER 02/16/2024 AND DOCUMENTATION IN MEDIA 02/19/2024/TS    H&p REFERS TO LABS. PT REPORTED LABS HAVE NOT BEEN COMPLETED D/T COST. PT TO CONTACT DR SUEELLEN OFFICE TO DISCUSS LABS/TS  .   THIS NURSE SPOKE TO RECEPTIONIST AT DR SUEELLEN OFFICE TO NOTIFY LABS HAVE NOT BEEN COMPLETED AND IF LABS NOT REQUIRED FOR MEDICAL CLEARANCE, H&p NOTE NEEDS TO BE UPDATE TO REFLECT LABS NOT REQUIRED/TS     Pt VERBALIZED UNDERSTANDING NEEDS POST OF CARE GIVER FOR 24 HOURS AFTER ANESTHESIA/TS    MULTIPLE PHONE CALLS AND LENGTHY PHONE CALL, ESTIMATED NURSE TIME 1.5 HOUR/TS

## 2024-02-27 NOTE — Progress Notes (Addendum)
 02/27/2024 0933 AM:    PRE-OP INSTRUCTIONS FOR PROCEDURE WITH ANESTHESIA PATIENT          Arrive at 8 AM for your procedure on 03/09/2024 at 10 AM   Arrange for someone to drive you home and be with you for the first 24 hours after discharge.     We allow ONLY 2 ADULTS to accompany you  One person MUST stay at hospital entire time for this outpatient test    Enter the MAIN entrance located on [address] and report to the surgical desk on the LEFT side of the lobby. Please park in the parking garage or there is free [employer] available after 7am for your use.    Bring your insurance card & photo ID with you to register.  MEDICATIONS:  Take AMLODIPINE  am of procedure with small sip of water. MAY TAKE ZANAFLEX .  A Pre-Surgical History and Physical MUST be completed WITHIN 30 DAYS OR LESS prior to your procedure by your Physician or an Urgent Care.  CONTACT DR SUEELLEN OFFICE TO DISCUSS IF LABS REQUIRED FOR MEDICAL CLEARANCE.    DO NOT EAT or DRINK ANYTHING AFTER MIDNIGHT prior to arrival for your procedure.     No gum, candy, mints, or ice chips day of procedure.   Please refrain from drinking alcohol the day before or day of your procedure.   Do not use any recreational marijuana at least 24 hours or street drugs (heroin, cocaine) at minimum 5 days prior to your procedure.   Please do not smoke the day of your procedure.    Dress in loose, comfortable clothing appropriate for redressing after your procedure.     NO SMOKING, VAPING, MARIJUANA, ALCOHOL 24 HOURS PRIOR TO PROCEDURE.  Do not wear jewelry (including body piercings), make-up, fingernail polish, lotion, powders, or metal hairclips.   Contacts, glasses, dentures, and hearing aids will need to be removed prior to procedure .  Bring your case(s) to protect them while you are in your procedure.    If you use a CPAP, please bring it with you on the day of your procedure.  If you use oxygen at home, please bring your oxygen tank with you to hospital.  Leave  all other valuables at home.  FOR WOMAN OF CHILDBEARING AGE ONLY- please make sure we can collect a urine sample upon arrival.    Ellouise Breen, RN9:33 AM08/22/25

## 2024-03-02 NOTE — Telephone Encounter (Signed)
 PLEASE CLARIFY IF PROCEDURE IS MRI ONLY AND THAT LABS NOT NEEDED PRIOR TO PLANNED SEDATION

## 2024-03-03 NOTE — Telephone Encounter (Signed)
 OK TO HOLD LABS IF NOT NEEDED PER ANESTHESIOLOGY

## 2024-03-04 NOTE — Telephone Encounter (Signed)
 Pt is informed.

## 2024-03-05 NOTE — Telephone Encounter (Addendum)
 Pt is requesting for you to please give her a call pretending her procedure on Tuesday. Please give pt a call.

## 2024-03-05 NOTE — Telephone Encounter (Signed)
 PLEASE WITH PROVIDER THAT LABS NOT NEEDED PRIOR TO PLANNED SEDATION

## 2024-03-05 NOTE — Telephone Encounter (Signed)
 Dr. Brooke spoke with Betty Cox and states pt have to get labs before procedure. Pt is informed

## 2024-03-05 NOTE — Telephone Encounter (Signed)
 Discussed with pt in detail  See other message in chart

## 2024-03-05 NOTE — Telephone Encounter (Signed)
 Called and discussed with pt  Plan for general anaesthesia for procedure  Advised on labs ordered that are specific to planned procedure - cbc, comp panel  Pt verbalized understanding    Also ok if Anaesthesia does not require labs - will defer to anesthesiologist

## 2024-03-06 ENCOUNTER — Inpatient Hospital Stay: Payer: PRIVATE HEALTH INSURANCE | Primary: Internal Medicine

## 2024-03-06 DIAGNOSIS — Z01818 Encounter for other preprocedural examination: Principal | ICD-10-CM

## 2024-03-06 DIAGNOSIS — E559 Vitamin D deficiency, unspecified: Secondary | ICD-10-CM

## 2024-03-06 LAB — CBC WITH AUTO DIFFERENTIAL
Basophils %: 0.8 %
Basophils Absolute: 0.1 K/uL (ref 0.0–0.2)
Eosinophils %: 1.4 %
Eosinophils Absolute: 0.1 K/uL (ref 0.0–0.6)
Hematocrit: 41.9 % (ref 36.0–48.0)
Hemoglobin: 14 g/dL (ref 12.0–16.0)
Lymphocytes %: 43.3 %
Lymphocytes Absolute: 4.2 K/uL (ref 1.0–5.1)
MCH: 28.2 pg (ref 26.0–34.0)
MCHC: 33.4 g/dL (ref 31.0–36.0)
MCV: 84.4 fL (ref 80.0–100.0)
MPV: 9.3 fL (ref 5.0–10.5)
Monocytes %: 8 %
Monocytes Absolute: 0.8 K/uL (ref 0.0–1.3)
Neutrophils %: 46.5 %
Neutrophils Absolute: 4.5 K/uL (ref 1.7–7.7)
Platelets: 274 K/uL (ref 135–450)
RBC: 4.96 M/uL (ref 4.00–5.20)
RDW: 15.8 % — ABNORMAL HIGH (ref 12.4–15.4)
WBC: 9.7 K/uL (ref 4.0–11.0)

## 2024-03-06 LAB — COMPREHENSIVE METABOLIC PANEL
ALT: 32 U/L (ref 10–40)
AST: 28 U/L (ref 15–37)
Albumin/Globulin Ratio: 1.1 (ref 1.1–2.2)
Albumin: 4.2 g/dL (ref 3.4–5.0)
Alkaline Phosphatase: 104 U/L (ref 40–129)
Anion Gap: 12 (ref 3–16)
BUN: 14 mg/dL (ref 7–20)
CO2: 24 mmol/L (ref 21–32)
Calcium: 9.4 mg/dL (ref 8.3–10.6)
Chloride: 106 mmol/L (ref 99–110)
Creatinine: 0.8 mg/dL (ref 0.6–1.1)
Est, Glom Filt Rate: 87 (ref >60)
Glucose: 94 mg/dL (ref 70–99)
Potassium: 3.8 mmol/L (ref 3.5–5.1)
Sodium: 142 mmol/L (ref 136–145)
Total Bilirubin: <0.2 mg/dL (ref 0.0–1.0)
Total Protein: 7.9 g/dL (ref 6.4–8.2)

## 2024-03-09 ENCOUNTER — Inpatient Hospital Stay
Admit: 2024-03-09 | Discharge: 2024-03-09 | Payer: PRIVATE HEALTH INSURANCE | Attending: Anesthesiology | Primary: Internal Medicine

## 2024-03-09 VITALS — BP 111/80 | HR 77 | Temp 97.20000°F | Resp 13 | Ht 64.02 in | Wt 199.4 lb

## 2024-03-09 DIAGNOSIS — M5136 Other intervertebral disc degeneration, lumbar region with discogenic back pain only: Principal | ICD-10-CM

## 2024-03-09 MED ORDER — MIDAZOLAM HCL 2 MG/2ML IJ SOLN
2 | Freq: Once | INTRAMUSCULAR | Status: AC
Start: 2024-03-09 — End: 2024-03-09

## 2024-03-09 MED ORDER — NORMAL SALINE FLUSH 0.9 % IV SOLN
0.9 | Freq: Two times a day (BID) | INTRAVENOUS | Status: DC
Start: 2024-03-09 — End: 2024-03-10

## 2024-03-09 MED ORDER — NORMAL SALINE FLUSH 0.9 % IV SOLN
0.9 | INTRAVENOUS | Status: DC | PRN
Start: 2024-03-09 — End: 2024-03-10

## 2024-03-09 MED ORDER — ONDANSETRON HCL 4 MG/2ML IJ SOLN
4 | Freq: Once | INTRAMUSCULAR | Status: DC | PRN
Start: 2024-03-09 — End: 2024-03-10

## 2024-03-09 MED ORDER — ONDANSETRON HCL 4 MG/2ML IJ SOLN
4 | Freq: Once | INTRAMUSCULAR | Status: DC | PRN
Start: 2024-03-09 — End: 2024-03-09
  Administered 2024-03-09: 14:00:00 4 via INTRAVENOUS

## 2024-03-09 MED ORDER — FENTANYL CITRATE (PF) 100 MCG/2ML IJ SOLN
100 | INTRAMUSCULAR | Status: DC | PRN
Start: 2024-03-09 — End: 2024-03-10

## 2024-03-09 MED ORDER — SUGAMMADEX SODIUM 200 MG/2ML IV SOLN
200 | Freq: Once | INTRAVENOUS | Status: DC | PRN
Start: 2024-03-09 — End: 2024-03-09
  Administered 2024-03-09: 15:00:00 300 via INTRAVENOUS

## 2024-03-09 MED ORDER — ROCURONIUM BROMIDE 50 MG/5ML IV SOLN
50 | Freq: Once | INTRAVENOUS | Status: DC | PRN
Start: 2024-03-09 — End: 2024-03-09
  Administered 2024-03-09: 14:00:00 100 via INTRAVENOUS

## 2024-03-09 MED ORDER — HYDRALAZINE HCL 20 MG/ML IJ SOLN
20 | INTRAMUSCULAR | Status: DC | PRN
Start: 2024-03-09 — End: 2024-03-10

## 2024-03-09 MED ORDER — PROPOFOL 1000 MG/100ML IV EMUL
1000 | Freq: Once | INTRAVENOUS | Status: DC | PRN
Start: 2024-03-09 — End: 2024-03-09
  Administered 2024-03-09: 14:00:00 200 via INTRAVENOUS
  Administered 2024-03-09: 15:00:00 100 via INTRAVENOUS

## 2024-03-09 MED ORDER — NALOXONE HCL 0.4 MG/ML IJ SOLN
0.4 | INTRAMUSCULAR | Status: DC | PRN
Start: 2024-03-09 — End: 2024-03-10

## 2024-03-09 MED ORDER — LABETALOL HCL 5 MG/ML IV SOLN
5 | INTRAVENOUS | Status: DC | PRN
Start: 2024-03-09 — End: 2024-03-10

## 2024-03-09 MED ORDER — OXYCODONE HCL 5 MG PO TABS
5 | ORAL | Status: AC | PRN
Start: 2024-03-09 — End: 2024-03-09

## 2024-03-09 MED ORDER — LACTATED RINGERS IV SOLN
INTRAVENOUS | Status: DC
Start: 2024-03-09 — End: 2024-03-10
  Administered 2024-03-09: 13:00:00 via INTRAVENOUS

## 2024-03-09 MED ORDER — MIDAZOLAM HCL 2 MG/2ML IJ SOLN
2 | INTRAMUSCULAR | Status: AC
Start: 2024-03-09 — End: 2024-03-09
  Administered 2024-03-09: 13:00:00 2 via INTRAVENOUS

## 2024-03-09 MED ORDER — HYDROMORPHONE HCL PF 1 MG/ML IJ SOLN
1 | INTRAMUSCULAR | Status: DC | PRN
Start: 2024-03-09 — End: 2024-03-10

## 2024-03-09 MED ORDER — LIDOCAINE HCL 2 % IJ SOLN
2 | Freq: Once | INTRAMUSCULAR | Status: DC | PRN
Start: 2024-03-09 — End: 2024-03-09
  Administered 2024-03-09: 14:00:00 100 via INTRAVENOUS

## 2024-03-09 MED ORDER — PROCHLORPERAZINE EDISYLATE 10 MG/2ML IJ SOLN
10 | Freq: Once | INTRAMUSCULAR | Status: DC | PRN
Start: 2024-03-09 — End: 2024-03-10

## 2024-03-09 MED ORDER — SODIUM CHLORIDE 0.9 % IV SOLN
0.9 | INTRAVENOUS | Status: DC | PRN
Start: 2024-03-09 — End: 2024-03-10

## 2024-03-09 MED FILL — MIDAZOLAM HCL 2 MG/2ML IJ SOLN: 2 mg/mL | INTRAMUSCULAR | Qty: 2 | Fill #0

## 2024-03-09 NOTE — Progress Notes (Incomplete)
 03/05/2024 1550 pm Dr Brooke called, THIS NURSE Confirmed pt is HAVING MRI W/ANESTHESIA, PER DR REAL PT NEEDS LABS FOR MEDICAL CLEARANCE, DR PAULENE OFFICE TO NOTIFY PATIENT LABS NEEDED PRIOR TO MRI W/ANESHESIA/TS
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
## 2024-03-09 NOTE — Anesthesia Post-Procedure Evaluation (Signed)
 Department of Anesthesiology  Postprocedure Note    Patient: Betty Cox  MRN: [id number]  Birthdate: [birth year]  Date of evaluation: 03/09/2024    Procedure Summary       Date: 03/09/24 Room / Location: [facility] MRI    Anesthesia Start: 1016 Anesthesia Stop: 1103    Procedure: MRI LUMBAR SPINE WO CONTRAST Diagnosis:       Lumbar discogenic pain syndrome      Radicular pain of right lower extremity      Radicular pain of left lower extremity      Degenerative spondylolisthesis      Lumbar spondylosis      Multilevel degenerative disc disease      Lumbar pain      (r/o HNP, stenosis)      (WITH FULL SEDATION/ANESTHESIA)    Scheduled Providers: Marnie Elida Norris, DO Responsible Provider: Marnie Elida Norris, DO    Anesthesia Type: general ASA Status: 3            Anesthesia Type: No value filed.    Aldrete Phase I: Aldrete Score: 9    Aldrete Phase II:      Anesthesia Post Evaluation    Patient location during evaluation: PACU  Patient participation: complete - patient participated  Level of consciousness: awake and alert  Pain score: 0  Airway patency: patent  Nausea & Vomiting: no nausea and no vomiting  Cardiovascular status: blood pressure returned to baseline  Respiratory status: nonlabored ventilation and room air  Hydration status: euvolemic  Pain management: adequate      No notable events documented.

## 2024-03-09 NOTE — Progress Notes (Signed)
 PACU Transfer to SDS    Vitals:    03/09/24 1118   BP:    Pulse: 77   Resp:    Temp:    SpO2:          Intake/Output Summary (Last 24 hours) at 03/09/2024 1119  Last data filed at 03/09/2024 1048  Gross per 24 hour   Intake 900 ml   Output --   Net 900 ml       Pain assessment:  none VS stable. No pain no nausea. Patient to transfer SDS for discharge home oxygen saturation 95% on room air.   Pain Level: 0    Patient transferred to care of SDS RN.    03/09/2024 11:19 AM

## 2024-03-09 NOTE — Discharge Instructions (Signed)
 [facility] PROCEDURE DISCHARGE INSTRUCTIONS    There are potential side effects of anesthesia or sedation you may experience for the first 24 hours.  These side effects include:    Confusion or Memory loss, Dizziness, or Delayed Reaction Times   [x] A responsible person should be with you for the next 24 hours.  Do not operate any vehicles (automobiles, bicycles, motorcycles) or power tools or machinery for 24 hours.  Do not sign any legal documents or make any legal decisions for 24 hours. Do not drink alcohol for 24 hours or while taking narcotic pain medication.      Nausea    [x] Start with light diet and progress to your normal diet as you feel like eating. However, if you experience nausea or repeated episodes of vomiting which persist beyond 12-24 hours, notify your physician.  Once nausea has passed, remember to keep drinking fluids.    Difficulty Passing Urine  [x] Drink extra amounts of fluid today.  Notify your physician if you have not urinated within 8 hours after your procedure or you feel uncomfortable.      Irritated Throat from a Breathing Tube  [x] Drink extra amounts of fluid today.  Lozenges may help.    Muscle Aches  [x] You may experience some generalized body aches as your muscles recover from medications used to relax them during surgery.  These will gradually subside.    MEDICATION INSTRUCTIONS:  [] Prescription(S) x     sent with you.  Use as directed.  When taking pain medications, you may experience the side effect of dizziness or drowsiness.  Do not drink alcohol or drive when taking these medications.  [] Prescription(S) x          Called to Pharmacy Name and location:    [x] Give the list of your medications to your primary care physician on your next visit. Keep your med list updated and carry it with in case of emergencies.    [x]  Narcotic pain medications can cause the side effect of significant constipation.  You may want to add a stool softener to your postoperative  medication schedule or speak to your surgeon on how best to manage this side effect.    NARCOTIC SAFETY:  Your pain medicine is only for you to take.  Safely store your medicines.  Store pills up high and out of reach of children and pets.  Ensure safety caps are snapped tightly  Keep track of how many pills you have left    Unused medication can be disposed of by taking them to a drop-off box or take-back program that is authorized by the [facility].  Access to a site near you can be found on the [employer] website (deadiversion.PrepaidCDs.co.nz).    If you have a CPAP machine, it is very important that you use it daily during all periods of sleep and daytime rest during your recovery at home.  Surgery and Anesthesia place a significant amount of stress on your body.  Using your CPAP will help keep you safe and lessen the negative effects of that stress.    FOLLOW-UP RECOVERY CARE:  [x] Call the office  for follow-up appointment and problems    Watch for these possible complications, symptoms, or side effects of anesthesia.  Call physician if they or any other problems occur:  Signs of INFECTION   > Fever over 101     > Redness, swelling, hardness or warmth at the operative site   >Foul smelling or cloudy drainage  at the operative site   Unrelieved PAIN  Unrelieved NAUSEA  Blood soaked dressing.  (Some oozing may be normal)  Inability to urinate      Numb, pale, blue, cold or tingling extremity      Physician:    The above instructions were reviewed with patient/significant other.  The following additional patient specific information was reviewed with the patient/significant other:  [] Procedure/physician specific instructions  [] Medication information sheet(S) including potential side effects    I have read and understand the instructions given to me: ____________________________________________   (Patient/S.O. Signature)            Date/time 03/09/2024 11:12 AM                 If you smoke STOP. We care  about your health!

## 2024-03-09 NOTE — Progress Notes (Signed)
 From MRI to PACU.   Post   Procedure: MRI LUMBAR SPINE WO CONTRAST Diagnosis:      Lumbar discogenic pain syndrome      Radicular pain of right lower extremity      Radicular pain of left lower extremity      Degenerative spondylolisthesis      Lumbar spondylosis      Multilevel degenerative disc disease      Lumbar pain      (r/o HNP, stenosis)      (WITH FULL SEDATION/ANESTHESIA)   Scheduled Providers: Marnie Elida Norris, DO Responsible Provider: Marnie Elida Norris, DO   Report received from CRNA at bedside.   Patient awake- no complaints of pain or nausea.   VS stable.

## 2024-03-09 NOTE — Progress Notes (Signed)
 PACU DISCHARGE NOTE    Vital signs stable, pain well controlled, alert and oriented times three or at baseline, follow up per surgeon, no anesthetic complications.    Instructions given to patient and daughter at bedsdei.   IV dc/d, dressed and daughter driving her home.

## 2024-03-10 ENCOUNTER — Inpatient Hospital Stay: Payer: PRIVATE HEALTH INSURANCE | Primary: Internal Medicine

## 2024-03-15 ENCOUNTER — Ambulatory Visit
Admit: 2024-03-15 | Discharge: 2024-03-15 | Payer: Medicaid (Managed Care) | Attending: Internal Medicine | Primary: Internal Medicine

## 2024-03-15 DIAGNOSIS — M5127 Other intervertebral disc displacement, lumbosacral region: Principal | ICD-10-CM

## 2024-03-15 MED ORDER — CELECOXIB 200 MG PO CAPS
200 | ORAL_CAPSULE | Freq: Every day | ORAL | 1 refills | Status: AC | PRN
Start: 2024-03-15 — End: ?

## 2024-03-15 NOTE — Progress Notes (Signed)
 Chief Complaint:   Chief Complaint   Patient presents with    Lower Back Pain     has gotten worse, pain and radic has made it difficult to do ADLs, TR MRI          History of Present Illness:       Patient is a 53 y.o. female who returns in follow up for the above complaint. The patient was last seen approximately 3 weeksago. The symptoms   show no change since the last visit. The patient has had further testing for the problem.      MRI completed in the interim.    Back:Bilateralleg pain 50:50 . Pain in back is aching, sharp, or numbness in quality.  Numbness in posterior and anterior thighs does not radiate below the knees    The symptoms back pain do not follow a neurogenic provocative pattern.    Pain levels: Greater than 5.  Pain is constant sitting equally problematic as standing.    The patient denies new onset or progressive weakness of the lower extremities.  The patient denies now onset bowel or bladder function.    She continues on medical pain management as per previous  inclusive of NSAID- , muscle relaxant-                 Present Medications:         Current Outpatient Medications   Medication Sig Dispense Refill    celecoxib  (CELEBREX ) 200 MG capsule Take 1 capsule by mouth daily as needed for Pain 30 capsule 1    VALERIAN PO Take 1 drop by mouth 2 times daily as needed (pain)      ASHWAGANDHA PO Take 1 drop by mouth daily as needed      Moringa Oleifera (MORINGA PO) Take 1 drop by mouth daily as needed      DANDELION PO Take 1 drop by mouth daily as needed      traMADol  (ULTRAM ) 50 MG tablet Take 1 tablet by mouth every 6 hours as needed for Pain.      vitamin D (VITAMIN D3) 25 MCG (1000 UT) CAPS Take 1 capsule by mouth daily 90 capsule 0    tiZANidine  (ZANAFLEX ) 4 MG tablet Take 1 tablet by mouth every 8 hours as needed (PRN) 30 tablet 1    amLODIPine  (NORVASC ) 10 MG tablet TAKE ONE TABLET BY MOUTH DAILY 90 tablet 1    lidocaine  (LIDODERM ) 5 % Place 1 patch onto the skin daily as needed for Pain  12 hours on, 12 hours off. 30 patch 0    Elastic Bandages & Supports (LUMBAR BACK BRACE/SUPPORT PAD) MISC 1 each by Does not apply route daily 1 each 0    Ascorbic Acid (VITAMIN C) 250 MG tablet Take 1 tablet by mouth daily      Flaxseed, Linseed, (FLAX SEED OIL) 1000 MG CAPS Take 2,000 mg by mouth daily      Omega-3 Fatty Acids (FISH OIL) 1000 MG CAPS Take 2 capsules by mouth 3 times daily      Multiple Vitamins-Minerals (THERAPEUTIC MULTIVITAMIN-MINERALS) tablet Take 1 tablet by mouth daily      Garlic 500 MG TABS Take 100 mg by mouth daily       No current facility-administered medications for this visit.         Allergies:             Review of Symptoms:    Pertinent items are noted in HPI  Vital Signs:    There were no vitals filed for this visit.     General Examination:    Constitutional: Patient is adequately groomed with no evidence of malnutrition       Physical Exam: lower back       Primary Exam:    Inspection:  No deformity, atrophy or appreciable curvature      Palpation:  No focal trigger point tenderness      Range of Motion: 55/10; pain in extension.      Strength:  Normal lower extremity       Special Tests:  NegativeSLRbilateral      Skin: There are no rashes, ulcerations or lesions.      Gait: Non antalgic       Neurovascular - non focal and intact      Additional Comments:        Additional Examinations:                   Office Imaging Results/Procedures PerformedToday:             Office Procedures:   No orders of the defined types were placed in this encounter.          Other Outside Imaging and Testing Personally Reviewed:         Reading Physician Reading Date Result Priority   Georgina Donnice PARAS, MD  [phone]  03/09/2024      Narrative & Impression  EXAM: MRI LUMBAR SPINE WO CONTRAST     INDICATION: M51.360: Lumbar discogenic pain syndrome     COMPARISON: Lumbar spine radiographs 12/29/2023     TECHNICAL: Multiplanar, multisequence MR imaging of the lumbar spine obtained without  contrast.     FINDINGS:     CONUS: Conus is normal signal and terminates at L1-L2 disc level.     BONES: Normal vertebral body heights. There is an indeterminant 1.3 cm focus of heterogeneous STIR hyperintensity with T1 hypointensity in the right aspect of the L5 vertebral body. The appearance is nonspecific.     VISUALIZED PERITONEUM/RETROPERITONEUM: No significant findings     PARASPINAL: Normal paraspinal muscle signal for age.     L1-L2: Normal     L2-L3: Minimal disc bulge, no central or significant foraminal stenosis     L3-L4: Disc bulge causes mild to moderate bilateral foraminal stenosis, no central stenosis     L4-L5: There is moderate facet osteoarthritis with bilateral facet effusions. No evidence for synovial cyst. There is disc bulge superimposed on grade I anterolisthesis, moderate bilateral foraminal stenosis, mild thecal sac narrowing     L5-S1: Moderate facet osteoarthritis with grade I anterolisthesis causing to moderate bilateral foraminal stenosis, severe disc bulge abuts the thecal sac without nerve root compression.     OTHER FINDINGS: None.     IMPRESSION:        1. Spondylosis and grade I degenerative spondylolisthesis causing moderate bilateral foraminal stenosis at L4-L5 and L5-S1  2. Nonspecific 1.3 cm focus of STIR hyperintensity involving the right aspect of the L5 vertebral body. Follow-up in 3 months should be considered to confirm stability.     Electronically signed by Donnice Georgina, MD        Exam Ended: 03/09/24 10:47 EDT Last Resulted: 03/09/24 12:54 EDT         Assessment   Impression: .    Encounter Diagnoses   Name Primary?    Displacement of lumbosacral intervertebral disc Yes    Degenerative spondylolisthesis  Foraminal stenosis of lumbosacral region     Radicular pain of left lower extremity     Radicular pain of right lower extremity     Lumbar spondylosis     Multilevel degenerative disc disease               Plan:       lumbar spine stabilization program  Activity  modification, lumbar spine precautions  Continue lumbar spinestabilization home exercise program  Consider Him a candidate for bilateral L5/S1 TF spine intervention injection  Medical pain management: NSAID: Celebrex , muscle relaxant: Zanaflex  and maximize local measures for symptoms control   Recommend off work for 1 day after scheduled injection  Surveillance MRI in 3 months as relates to the nonspecific 1.3 cm focus of STIR hyperintensity involving the right aspect of the L5 vertebral body.     30 minutes was spent related to previewing pertinent medical documentation prior to the patient's visit along with counseling during the patient's visit with respect to treatment options inclusive of alternatives to treatment and the complications and risks related to those treatment options along with expectations of outcome related to those treatments and inclusive of time in the documentation and ordering of testing and treatment after the visit.      Orders:      No orders of the defined types were placed in this encounter.        Disclaimer:    This note was dictated with voice recognition software. Though review and correction are routine, we apologize for any errors.

## 2024-03-18 NOTE — Telephone Encounter (Signed)
 IS REFERRAL RELATED TO LOW BACK PAIN?  [facility] LOCATION WOULD PT PREFER ?\  PLEASE CALL PT AND CLARIFY

## 2024-03-19 ENCOUNTER — Telehealth

## 2024-03-19 NOTE — Telephone Encounter (Addendum)
 Pt CALLED REGARDING REFERRAL SHE STATES YES ITS RELATED TO BACK PAIN AND MRI RESULTS. PLEASE ADVISE     [facility] DR.RANDALL HLUBEK [address] [id number]

## 2024-03-22 ENCOUNTER — Encounter

## 2024-03-22 MED ORDER — TIZANIDINE HCL 4 MG PO TABS
4 | ORAL_TABLET | Freq: Three times a day (TID) | ORAL | 1 refills | 30.00000 days | Status: AC | PRN
Start: 2024-03-22 — End: ?

## 2024-03-22 NOTE — Telephone Encounter (Signed)
 Rx Request:Tizanidine     Last Filled: 12/29/2023 for 10 days with 1 refill  Refill Due: 01/18/2024  Last OV: 03/15/2024  Follow Up:  None

## 2024-03-22 NOTE — Telephone Encounter (Signed)
 Medication:   Requested Prescriptions     Pending Prescriptions Disp Refills    amLODIPine  (NORVASC ) 10 MG tablet 90 tablet 1     Sig: TAKE ONE TABLET BY MOUTH DAILY        Last Filled:      Patient Phone Number: [phone] (home)     Last appt: 02/16/2024   Next appt: Visit date not found    Last OARRS:        No data to display

## 2024-03-24 MED ORDER — AMLODIPINE BESYLATE 10 MG PO TABS
10 | ORAL_TABLET | ORAL | 1 refills | 90.00000 days | Status: DC
Start: 2024-03-24 — End: 2024-06-14

## 2024-03-24 NOTE — Telephone Encounter (Signed)
 CALLED AND D/W PT    NEUROSURGERY REFERRAL SENT AS REQUESTED FOR PERSISTENT LOW BACK PIN, ABNORMAL MRI    NORVASC  REFILLED AS REQUESTED

## 2024-03-24 NOTE — Telephone Encounter (Signed)
 REFERRAL ADDRESSED. SEE OTHER MESSAGE IN EPIC

## 2024-04-14 NOTE — Telephone Encounter (Signed)
 MAKE APPT

## 2024-04-16 ENCOUNTER — Encounter: Payer: Medicaid (Managed Care) | Attending: Internal Medicine | Primary: Internal Medicine

## 2024-04-19 ENCOUNTER — Ambulatory Visit
Admit: 2024-04-19 | Discharge: 2024-04-19 | Payer: Medicaid (Managed Care) | Attending: Internal Medicine | Primary: Internal Medicine

## 2024-04-19 MED ORDER — METHYLPREDNISOLONE 4 MG PO TBPK
4 | PACK | ORAL | 0 refills | Status: AC
Start: 2024-04-19 — End: 2024-04-25

## 2024-04-19 MED ORDER — KETOROLAC TROMETHAMINE 60 MG/2ML IM SOLN
60 | Freq: Once | INTRAMUSCULAR | Status: AC
Start: 2024-04-19 — End: 2024-04-19
  Administered 2024-04-19: 16:00:00 60 mg via INTRAMUSCULAR

## 2024-04-19 NOTE — Patient Instructions (Signed)
"  TAKE MED AS ADVISED    DIET/ EXERCISE.    FOLLOW UP WITHIN  2 MONTHS / AS NEEDED    FOLLOW UP FOR CAT SCAN    [facility] Locations  No appointment necessary  Open Monday - Friday     CENTRAL  EAST  WEST     [facility]   [address]   [address]   [place], [place] [address]    Ph: [phone]  [facility]   [address] [place], [place] [address]    Ph: [phone]   [facility]   [address],    [place], [place] [address]    Ph: [phone]     [facility]   [address]    [place], [place] [address]    Ph: [phone]  [facility]   [address]    [place], [place] [address]   Ph: [phone]  [facility]   [address]   [place], [place] [address]    Ph: [phone]      [facility]   [address]    [place], [place] [address]   Ph: [phone]    [facility]   [address]   [place], [place] [address]    Ph: [phone]   [facility]   [address]   [place], [place] [address]   Ph: [phone]  [facility]   [address]   [place], [address]    [facility]: [phone] [address]   [address]   [place], [place] [address]   Ph: [phone]  [facility]   [address]    [place]., [place] [address]    Ph: [phone]        Call your preferred location for business hours, test preparation and additional information. [employer] accepts [employer].             "
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
## 2024-04-20 ENCOUNTER — Inpatient Hospital Stay: Admit: 2024-04-20 | Payer: Medicaid (Managed Care) | Primary: Internal Medicine

## 2024-05-11 ENCOUNTER — Encounter

## 2024-05-12 ENCOUNTER — Encounter

## 2024-05-17 NOTE — Result Encounter Note (Signed)
"  Called patient. No answer.   "

## 2024-06-01 ENCOUNTER — Encounter: Payer: Medicaid (Managed Care) | Attending: Internal Medicine | Primary: Internal Medicine

## 2024-06-01 NOTE — HIE Documentation (Unsigned)
 "    Transcription Authentication Interface Message Text  Pre-operative History and Physical  Patient: Betty Cox MRN: [id number]  Billing Number: [id number]  Date of Birth: [birth year]  Age: 53 year old  Sex: female  Date of Surgery: 06/01/2024  Chief Complaint: here for MRI C spine with anesthesia  Diagnosis: Neck pain  Procedure:  MRI C spine w/o contrast with anesthesia  Surgeon: * No surgeons listed *  Allergies: Allergies  Allergies  Allergen Reactions  ?? Penicillins Hives  Subjective:  History of Present Illness: The patient is a 53 year old female who presents   for  MRI of her cervical spine without contrast with general anesthesia due to   severe  claustrophobia. She has such severe claustrophobia that she cannot even see   the  MRI machine or else it will set off significant anxiety and she will have bad  dreams about it for at least a week. She has been in her normal state of   health  other than having to limit her physical activity the past month or so and it   is  starting to get to her as her quality of life is not as good as usual when she  is very physically active.  Past Medical History:  Diagnosis Date  ?? Endometriosis  ?? Migraines  ?? Unspecified essential hypertension  Past Surgical History:  Procedure Laterality Date  ?? CESAREAN SECTION, CLASSIC  Laparoscopy for endometriosis  Tubal ligation  History reviewed. No pertinent family history.  Social History  Socioeconomic History  ?? Marital status: Single    Spouse name: Not on file  ?? Number of children: Not on file  ?? Years of education: Not on file  ?? Highest education level: Not on file  Occupational History  ?? Not on file  Tobacco Use  ?? Smoking status: Every Day    Types: Cigars  ?? Smokeless tobacco: Not on file  ?? Tobacco comments:    states she smokes 8 cigars daily  Vaping Use  ?? Vaping status: Never Used  Substance and Sexual Activity  ?? Alcohol use: Yes    Comment: occ  ?? Drug use: Not on file  ?? Sexual  activity: Not on file  Other Topics Concern  ?? Not on file  Social History Narrative  ?? Not on file  Social Drivers of Health  Financial Resource Strain: Low Risk  (04/02/2023)   Received from [facility]   Overall Financial Resource Strain (CARDIA)   ?? Difficulty of Paying Living Expenses: Not hard at all  Food Insecurity: No Food Insecurity (10/08/2023)   Received from [facility]   Hunger Vital Sign   ?? Within the past 12 months, you worried that your food would run out before  you got the money to buy more.: Never true   ?? Within the past 12 months, the food you bought just didn't last and you  didn't have money to get more.: Never true  Transportation Needs: No Transportation Needs (10/08/2023)   Received from [facility]   PRAPARE - Transportation   ?? Lack of Transportation (Medical): No   ?? Lack of Transportation (Non-Medical): No  Physical Activity: Insufficiently Active (04/01/2023)   Received from [facility]   Exercise Vital Sign   ?? On average, how many days per week do you engage in moderate to strenuous  exercise (like a brisk walk)?: 3 days   ??  On average, how many minutes do you engage in exercise at this level?: 20   min  Stress: Not on file  Social Connections: Not on file  Intimate Partner Violence: Not on file  Housing Stability: Low Risk  (10/08/2023)   Received from [facility]   Housing Stability Vital Sign   ?? In the last 12 months, was there a time when you were not able to pay the  mortgage or rent on time?: No   ?? In the past 12 months, how many times have you moved where you were   living?:  0   ?? At any time in the past 12 months, were you homeless or living in a   shelter  (including now)?: No  History of adverse reaction to anesthesia? No  Patient/Family history of malignant hyperthermia or pseudocholinesterase  deficiency? No  Home Medications:  Prior to Admission  medications  Medication Sig Start Date End Date Taking? Authorizing Provider  tiZANidine  (ZANAFLEX ) 4 MG TABS Take 4 mg by mouth every 8 (eight) hours as  needed.   Yes HISTORICAL MED  celecoxib  (CELEBREX ) 200 MG CAPS Take 200 mg by mouth daily as needed for   Pain.  Yes HISTORICAL MED  ASHWAGANDHA PO Take  by mouth 2 (two) times daily.   Yes HISTORICAL MED  Moringa Oleifera (MORINGA PO) Take  by mouth 2 (two) times daily.   Yes  HISTORICAL MED  DANDELION PO Take  by mouth 2 (two) times daily.   Yes HISTORICAL MED  Garlic TABS Take  by mouth.     Yes HISTORICAL MED  amLODIPine  (NORVASC ) 10 MG TABS Take 10 mg by mouth daily.    HISTORICAL MED  hydrOXYzine  Pamoate (VISTARIL ) 25 mg capsule Take 25 mg by mouth 3 (three)   times  daily as needed.    HISTORICAL MED  Took norvasc  today, nothing else.  Labs  @labs @  ECG  @ECG @  Review of Systems:  Constitutional: denies fever, denies significant weight loss, denies fatigue  HEENT: denies, facial pain, denies nasal congestion, denies sore throat,   denies  visual blurring, denies hearing loss  Breast: denies breast lump, denies nipple discharge, denies breast tenderness  Respiratory: denies asthma, denies DOE, denies cough  Cardiovascular: denies exertional CP/chest pressure, denies PND, denies  irregular heart beat  Gastrointestinal: denies nausea/vomiting, denies reflux symptoms, denies  diarrhea, + intermittent constipation  Genitourinary: denies urinary frequency, denies urinary urgency, denies  incontinence  Integumentary: denies rash, denies skin lesions, denies pruritus  Hematologic/Lymphatic: denies anemia, denies bleeding disorder, denies  lymphadenopathy  Musculoskeletal: denies back pain, denies arthritis, denies joint pain, no  recent falls  Neurological: denies seizure, denies migraines, denies syncope, denies   paralysis  Endocrine: denies thyroid disorder, denies diabetes  Psychiatric/Behavioral: denies depression, denies anxiety except anxiety  thinking about  the MRI machine, denies insomnia  Objective:  BP 125/80   Pulse 86   Temp 97.9  F (36.6  C) (Oral)   Resp 18   Ht 65  (165.1 cm)   Wt 201 lb (91.2 kg)   LMP 05/16/2011   SpO2 98%   BMI 33.45  kg/m??  Weight: Weight: 201 lb (91.2 kg)  Height: Height: 65 (165.1 cm)  O2 Saturation: SpO2: 98 %  Physical Exam:  Constitutional: no fever, no chills, no significant weight loss and alert,   calm  cooperative, pleasant well appearing female in NAD, no c/o pain or discomfort at  this time  HEENT: Normocephalic, without obvious abnormality, atraumatic and supple, no  nodes in the neck or supraclavicular regions, normal thyroid and no carotid  bruit  Heart: Regular, no murmur and no friction rub, no peripheral edema, no JVD  Breast: exam deferred  Lungs: Respirations unlabored, clear to auscultation and no chest wall  tenderness  Abdomen: Abdomen soft, non-tender. BS normal. No masses, organomegaly  Pelvic and Genitalia: exam deferred  Extremities: peripheal pulses detected, no peripheal edma , Extremities   normal.  No deformities, edema, or skin discoloration.  Integumentary: no swelling, no redness, no warmth, no drainage and no rashes  Neurological: alert, oriented to time, place, person and situation  Psychological: calm, good mood, mood appropriate for situation  Functional Capacity: 1 - 3 Met           Eat, dress, walk indoor around house  (unable to do more due to limitations from pain in the past month, but before  then describes herself as a fitness buff).  Plan of care: The patient is stable for planned surgery today. Anesthesia MD  will evaluate   preoperatively. The patient has no further questions at this time.  * No pre-op diagnosis entered *  Patient may proceed with planned surgery as scheduled.  Electronically Signed by: Donnice Finder, MD, 06/01/2024 9:26 AM  "

## 2024-06-01 NOTE — HIE Documentation (Unsigned)
 "    Transcription Authentication Interface Message Text  Pre-operative History and Physical  Patient: Betty Cox MRN: [id number]  Billing Number: [id number]  Date of Birth: Jan 02, 1971  Age: 53 year old  Sex: female  Date of Surgery: 06/01/2024  Chief Complaint: here for MRI C spine with anesthesia  Diagnosis: Neck pain  Procedure:  MRI C spine w/o contrast with anesthesia  Surgeon: * No surgeons listed *  Allergies: Allergies  Allergies  Allergen Reactions  ?? Penicillins Hives  Subjective:  History of Present Illness: The patient is a 54 year old female who presents   for  MRI of her cervical spine without contrast with general anesthesia due to   severe  claustrophobia. She has such severe claustrophobia that she cannot even see   the  MRI machine or else it will set off significant anxiety and she will have bad  dreams about it for at least a week. She has been in her normal state of   health  other than having to limit her physical activity the past month or so due to  neck pain and it is starting to get to her as her quality of life is not as   good  as usual when she is very physically active.  Past Medical History:  Diagnosis Date  ?? Endometriosis  ?? Migraines  ?? Unspecified essential hypertension  Past Surgical History:  Procedure Laterality Date  ?? CESAREAN SECTION, CLASSIC  Laparoscopy for endometriosis  Tubal ligation  History reviewed. No pertinent family history.  Social History  Socioeconomic History  ?? Marital status: Single    Spouse name: Not on file  ?? Number of children: Not on file  ?? Years of education: Not on file  ?? Highest education level: Not on file  Occupational History  ?? Not on file  Tobacco Use  ?? Smoking status: Every Day    Types: Cigars  ?? Smokeless tobacco: Not on file  ?? Tobacco comments:    states she smokes 8 cigars daily  Vaping Use  ?? Vaping status: Never Used  Substance and Sexual Activity  ?? Alcohol use: Yes    Comment: occ  ?? Drug use: Not on  file  ?? Sexual activity: Not on file  Other Topics Concern  ?? Not on file  Social History Narrative  ?? Not on file  Social Drivers of Health  Financial Resource Strain: Low Risk  (04/02/2023)   Received from [facility]   Overall Financial Resource Strain (CARDIA)   ?? Difficulty of Paying Living Expenses: Not hard at all  Food Insecurity: No Food Insecurity (10/08/2023)   Received from [facility]   Hunger Vital Sign   ?? Within the past 12 months, you worried that your food would run out before  you got the money to buy more.: Never true   ?? Within the past 12 months, the food you bought just didn't last and you  didn't have money to get more.: Never true  Transportation Needs: No Transportation Needs (10/08/2023)   Received from [facility]   PRAPARE - Transportation   ?? Lack of Transportation (Medical): No   ?? Lack of Transportation (Non-Medical): No  Physical Activity: Insufficiently Active (04/01/2023)   Received from [facility]   Exercise Vital Sign   ?? On average, how many days per week do you engage in moderate to strenuous  exercise (like a  brisk walk)?: 3 days   ?? On average, how many minutes do you engage in exercise at this level?: 20   min  Stress: Not on file  Social Connections: Not on file  Intimate Partner Violence: Not on file  Housing Stability: Low Risk  (10/08/2023)   Received from [facility]   Housing Stability Vital Sign   ?? In the last 12 months, was there a time when you were not able to pay the  mortgage or rent on time?: No   ?? In the past 12 months, how many times have you moved where you were   living?:  0   ?? At any time in the past 12 months, were you homeless or living in a   shelter  (including now)?: No  History of adverse reaction to anesthesia? No  Patient/Family history of malignant hyperthermia or pseudocholinesterase  deficiency? No  Home Medications:  Prior to  Admission medications  Medication Sig Start Date End Date Taking? Authorizing Provider  tiZANidine  (ZANAFLEX ) 4 MG TABS Take 4 mg by mouth every 8 (eight) hours as  needed.   Yes HISTORICAL MED  celecoxib  (CELEBREX ) 200 MG CAPS Take 200 mg by mouth daily as needed for   Pain.  Yes HISTORICAL MED  ASHWAGANDHA PO Take  by mouth 2 (two) times daily.   Yes HISTORICAL MED  Moringa Oleifera (MORINGA PO) Take  by mouth 2 (two) times daily.   Yes  HISTORICAL MED  DANDELION PO Take  by mouth 2 (two) times daily.   Yes HISTORICAL MED  Garlic TABS Take  by mouth.     Yes HISTORICAL MED  amLODIPine  (NORVASC ) 10 MG TABS Take 10 mg by mouth daily.    HISTORICAL MED  hydrOXYzine  Pamoate (VISTARIL ) 25 mg capsule Take 25 mg by mouth 3 (three)   times  daily as needed.    HISTORICAL MED  Took norvasc  today, nothing else.  Labs  @labs @  ECG  @ECG @  Review of Systems:  Constitutional: denies fever, denies significant weight loss, denies fatigue  HEENT: denies, facial pain, denies nasal congestion, denies sore throat,   denies  visual blurring, denies hearing loss  Breast: denies breast lump, denies nipple discharge, denies breast tenderness  Respiratory: denies asthma, denies DOE, denies cough  Cardiovascular: denies exertional CP/chest pressure, denies PND, denies  irregular heart beat  Gastrointestinal: denies nausea/vomiting, denies reflux symptoms, denies  diarrhea, + intermittent constipation  Genitourinary: denies urinary frequency, denies urinary urgency, denies  incontinence  Integumentary: denies rash, denies skin lesions, denies pruritus  Hematologic/Lymphatic: denies anemia, denies bleeding disorder, denies  lymphadenopathy  Musculoskeletal: denies back pain, denies arthritis, denies joint pain, no  recent falls  Neurological: denies seizure, denies migraines, denies syncope, denies   paralysis  Endocrine: denies thyroid disorder, denies diabetes  Psychiatric/Behavioral: denies depression, denies anxiety except  anxiety  thinking about the MRI machine, denies insomnia  Objective:  BP 125/80   Pulse 86   Temp 97.9  F (36.6  C) (Oral)   Resp 18   Ht 65  (165.1 cm)   Wt 201 lb (91.2 kg)   LMP 05/16/2011   SpO2 98%   BMI 33.45  kg/m??  Weight: Weight: 201 lb (91.2 kg)  Height: Height: 65 (165.1 cm)  O2 Saturation: SpO2: 98 %  Physical Exam:  Constitutional: no fever, no chills, no significant weight loss and alert,   calm  cooperative, pleasant well appearing female in NAD, no c/o  pain or discomfort at  this time  HEENT: Normocephalic, without obvious abnormality, atraumatic and supple, no  nodes in the neck or supraclavicular regions, normal thyroid and no carotid  bruit  Heart: Regular, no murmur and no friction rub, no peripheral edema, no JVD  Breast: exam deferred  Lungs: Respirations unlabored, clear to auscultation and no chest wall  tenderness  Abdomen: Abdomen soft, non-tender. BS normal. No masses, organomegaly  Pelvic and Genitalia: exam deferred  Extremities: peripheal pulses detected, no peripheal edma , Extremities   normal.  No deformities, edema, or skin discoloration.  Integumentary: no swelling, no redness, no warmth, no drainage and no rashes  Neurological: alert, oriented to time, place, person and situation  Psychological: calm, good mood, mood appropriate for situation  Functional Capacity: 1 - 3 Met           Eat, dress, walk indoor around house  (unable to do more due to limitations from pain in the past month, but before  then describes herself as a fitness buff).  Plan of care: The patient is stable for planned surgery today. Anesthesia MD  will evaluate   preoperatively. The patient has no further questions at this time.  * No pre-op diagnosis entered *  Patient may proceed with planned surgery as scheduled.  Electronically Signed by: Donnice Finder, MD, 06/01/2024 9:26 AM  "

## 2024-06-01 NOTE — CAA (Care Area Assessment) (Signed)
"  Formatting of this note is different from the original.  Anesthesia OR Handoff Note    Patient Name:  Betty Cox      MRI CERVICAL SPINE WO CON    Pre-op diagnosis: Spinal stenosis in cervical region    Surgeon/Service: * No surgeons listed *, * No service entered *    Patient location: PACU    Anesthesia type: General    Medical history:   Past Medical History:   Diagnosis Date    Endometriosis     Migraines     Unspecified essential hypertension      Intra-procedure management issues/concerns: Uneventful      Post-op pain: Adequate analgesia    Post-op assessment: no apparent anesthetic complications and tolerated procedure well    Vitals:   Vitals Value Taken Time   BP 113/74 06/01/24 11:09   Temp  06/01/24 11:13   Pulse 77 06/01/24 11:13   Resp 17 06/01/24 11:13   SpO2 99 % 06/01/24 11:13   Vitals shown include unfiled device data.  Heart rate, blood pressure, respiratory rate, and oxygen saturation have been recorded and reviewed on arrival to PACU. Refer to nursing documentation.    Vitals status: stable    Post-op Airway: Room Air    Level of consciousness: awake and alert    Complications: none    Plans for early post-procedure period: Progress to discharge from PACU    After opportunity for questions, ICU/PACU team accepts care of patient.     Receiving RN name: Mariel, RN    Additional Comments:       Electronically signed by Alben Dorn Ade, CRNA at 06/01/2024 11:13 AM EST  "

## 2024-06-01 NOTE — Anesthesia Preprocedure Evaluation (Signed)
"  Formatting of this note is different from the original.  ANESTHESIA EVALUATION    I have reviewed the patient summary  and existing labs  .   (-) No history of anesthetic complications.  (-) No family history of anesthetic complications .    Review of Systems and Medical History:   CNS/Musculoskeletal:    (+)   Psychiatric history: Depression: Anxiety .  Back problems (stenosis, bulging discs) .cervical and lumbar  Arthritis: osteoarthritis.  Chronic pain: Chronic low back pain: Other type of chronic pain: neck and lower back pain .(-)  Cerebral Vascular Disease,  seizures, headaches, dementia, paralysis, neuropathy, muscle weakness, neuromuscular disease . GI/Hepatic/Renal:   (-) GERD, no liver disease, no cirrhosis, no kidney disease, no bowel prep     Cardiovascular:   (+) Exercise tolerance:  4-6 Mets  Hypertension: well controlled: greater than 10 years.(-) chest pain or angina, CAD, dysrhythmia, CHF, pacemaker, valvular problems/murmurs, PVD, DVT, pulmonary embolism, anticoagulation therapy, Raynaud's phenomenon, pulmonary hypertension     Endocrine/Other:   (+) anemia: resolved.   obesity, BMI greater than or equal to 30. Body mass index is 33.45 kg/m. Steroid use within last 6 months(-) no diabetes, no thyroid problem, no blood dyscrasia, no hypercoagulable state, no transfusions, no sickle cell disease, no HIV, no glaucoma, no cancer, no diet pills used in last 6 months    Respiratory:  (-) asthma, COPD/Emphysema, respiratory infection recent, sleep apnea, tuberculosis, shortness of breath Substance Use/Abuse:  (+) Drug use. Pt is an active user.  drugs used  are marijuana  History of smoking  Pt. is a active smoker  Smoking pack years of 6 pack cigars.  Tobacco cessation counseling given by the PAT nurse.  Patient advised preoperatively to abstain from smoking on the day of surgery.(-)  alcohol use     Obstetrical Hx:     Obstetrics:         Non-Obstetrical Hx:        Pediatric Pt. 53 years old & under:           Airway:  Mallampati: II  TM distance: >3 FB  Neck ROM: limited        Cardiovascular: - cardiovascular exam normal  Rhythm: regular        Dental: - no notable dental history    .        Respiratory: - pulmonary exam normal         Gastrointestinal:   Obesity observed.    Abdomen is           Back:  - back exam normal           Anesthesia Plan:    ASA: 2    Plan: General          Induction: Intravenous    NPO status verified  Patient is appropriate for LMA  Consent:  - Anesthesia discussed major risks, alternatives, benefits, postop recovery process and destination.  Anesthesia plan understood and accepted.  All questions answered. The following person(s) agreed to procedure: patient    Use of blood products:   Discussed with patient      Discussed plan with:  CRNA          Electronically signed by Alben Dorn Ade, CRNA at 06/01/2024  9:52 AM EST  Electronically signed by Robin Glendia Shove, DO at 06/01/2024  9:57 AM EST  "

## 2024-06-07 ENCOUNTER — Ambulatory Visit: Payer: Medicaid (Managed Care) | Primary: Internal Medicine

## 2024-06-14 ENCOUNTER — Ambulatory Visit
Admit: 2024-06-14 | Discharge: 2024-06-14 | Payer: Medicaid (Managed Care) | Attending: Internal Medicine | Primary: Internal Medicine

## 2024-06-14 VITALS — BP 130/80 | HR 96 | Temp 98.70000°F | Wt 204.0 lb

## 2024-06-14 DIAGNOSIS — Z0000 Encounter for general adult medical examination without abnormal findings: Principal | ICD-10-CM

## 2024-06-14 LAB — POCT URINALYSIS DIPSTICK
Bilirubin, UA: NEGATIVE
Blood, UA POC: NEGATIVE
Glucose, UA POC: NEGATIVE mg/dL
Ketones, UA: NEGATIVE mg/dL
Leukocytes, UA: NEGATIVE
Nitrite, UA: NEGATIVE
Protein, UA POC: NEGATIVE mg/dL
Spec Grav, UA: 1.01
Urobilinogen, UA: 0.2 mg/dL
pH, UA: 7

## 2024-06-14 MED ORDER — AMLODIPINE BESYLATE 10 MG PO TABS
10 | ORAL_TABLET | ORAL | 1 refills | Status: AC
Start: 2024-06-14 — End: ?

## 2024-06-14 NOTE — ACP (Advance Care Planning) (Signed)
"  Advance Care Planning     Advance Care Planning (ACP) Physician/NP/PA Conversation    Date of Conversation: 06/14/2024  Conducted with: Patient with Decision Making Capacity    Healthcare Decision Maker:        Click here to complete Healthcare Decision Makers including selection of the Healthcare Decision Maker Relationship (ie Primary)  Today we documented Decision Maker(s) consistent with ACP documents on file.    Care Preferences:    Hospitalization:  If your health worsens and it becomes clear that your chance of recovery is unlikely, what would be your preference regarding hospitalization?  The patient would prefer hospitalization.    Ventilation:  If you were unable to breath on your own and your chance of recovery was unlikely, what would be your preference about the use of a ventilator (breathing machine) if it was available to you?  The patient would desire the use of a ventilator.    Resuscitation:  In the event your heart stopped as a result of an underlying serious health condition, would you want attempts made to restart your heart, or would you prefer a natural death?  Yes, attempt to resuscitate.    treatment goals, benefit/burden of treatment options, artificial nutrition, ventilation preferences, hospitalization preferences, and resuscitation preferences    Conversation Outcomes / Follow-Up Plan:  ACP complete - no further action today  Reviewed DNR/DNI and patient elects Full Code (Attempt Resuscitation)    Length of Voluntary ACP Conversation in minutes:  16 minutes    Avelina Bors, MD                       "
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
## 2024-06-14 NOTE — Patient Instructions (Signed)
"  TAKE MED AS ADVISED    DIET/ EXERCISE.    FOLLOW UP WITHIN 3 MONTHS / AS NEEDED    FOLLOW UP FOR FASTING LABS, ULTRASOUND    [facility] Locations  No appointment necessary  Open Monday - Friday     [facility]  [facility]  [facility]     [facility]   [address]   [address]   [place], [place] [address]    Ph: [phone]  [facility]   [address] [place], [place] [address]    Ph: [phone]   [facility]   [address]    [place], [place] [address]    Ph: [phone]     [facility]   [address]    [place], [place] [address]    Ph: [phone]  [facility]   [address]    [place], [place] [address]   Ph: [phone]  [facility]   [address] [facility] [address]   [place], [place] [address]    Ph: [phone]      [facility]   [address]    [place], [place] [address]   Ph: [phone]    [facility]   [address]   [place], [place] [address]    Ph: [phone]   [facility]   [address]   [place], [place] [address]   Ph: [phone]  [facility]   [address]   [place], [address]    [facility]: [phone] [address]   [address]   [place], [place] [address]   Ph: [phone]  [facility]   [address]    [place] [address]    Ph: [phone]        Call your preferred location for business hours, test preparation and additional information. [employer] accepts [employer].             "

## 2024-06-21 ENCOUNTER — Encounter

## 2024-06-25 ENCOUNTER — Ambulatory Visit: Payer: Medicaid (Managed Care) | Primary: Internal Medicine

## 2024-06-29 ENCOUNTER — Ambulatory Visit: Payer: Medicaid (Managed Care) | Primary: Internal Medicine

## 2024-07-02 ENCOUNTER — Inpatient Hospital Stay: Admit: 2024-07-02 | Payer: Medicaid (Managed Care) | Primary: Internal Medicine

## 2024-07-02 DIAGNOSIS — E041 Nontoxic single thyroid nodule: Principal | ICD-10-CM

## 2024-07-09 NOTE — Progress Notes (Signed)
"  PATIENT REACHED   YES____NO_x___    PREOP INSTRUCTIONS LEFT ON VM NUMBER__[phone]_____________      DATE__1/8/26_______ TIME__1330_______ARRIVAL_1230_______[address]____________  MAY HAVE A LIGHT MEAL PRIOR TO YOUR APPOINTMENT OR OR AS INSTRUCTED BY YOUR DR.  ROSINE NEED A RESPONSIBLE ADULT AGE 54 OR OLDER TO DRIVE YOU HOME  PLEASE BRING INSURANCE CARD.PICTURE ID AND COMPLETE LIST OF MEDS  WEAR LOOSE COMFORTABLE CLOTHING  FOLLOW ANY INSTRUCTIONS YOUR DRS OFFICE HAS GIVEN YOU,INCLUDING WHAT MEDICATIONS TO TAKE THE AM OF PROCEDURE AND WHEN AND IF YOU NEED TO STOP ANY BLOOD THINNERS. IF YOU HAVE QUESTIONS REGARDING THIS CALL THE OFFICE  THE GOAL BLOOD SUGAR THE AM OF PROCEDURE IS 200 OR LESS ABOVE THAT THE PROCEDURE MAY BE CANCELLED  ANY QUESTIONS CALL YOUR DOCTOR.ALSO,PLEASE READ THE INSTRUCTION PACKET FROM YOUR DR IF YOU RECEIVED ONE.  SPINE INTERVENTION NUMBER IS [phone]      OTHER___________________________________      VISITOR POLICY(subject to change)    Current policy is 2 visitors per patient. No children. Masks are required.    "

## 2024-07-13 ENCOUNTER — Encounter

## 2024-07-14 NOTE — Telephone Encounter (Signed)
 IMPRESSION:     1.  1.1 cm TR 4 nodule in the right lobe. Ultrasound follow-up in 12 months per ACR TI-RADS.  2.  0.8 cm TR 5 nodule in the left lobe. Ultrasound follow-up in 12 months per ACR TI-RADS.     THYROID U/S RECOMMENDED FOLLOW UP EVAL IN 1 YEAR  I DO NOT SEE ANY CONFLICT WITH YOUR PROPOSED PROCEDURE  PLEASE READDRESS WITH  ORTHO

## 2024-07-14 NOTE — Telephone Encounter (Signed)
"  MAKE APPT  WILL READDRESS AT VISIT  "

## 2024-07-21 NOTE — Telephone Encounter (Signed)
 OK FOR REFERRAL AS REQUESTED  PLEASE FAX AND INFORM PT
